# Patient Record
Sex: Female | Born: 1985 | Race: Black or African American | Hispanic: No | Marital: Married | Smoking: Current every day smoker
Health system: Southern US, Community
[De-identification: ages and names within clinical notes are randomized; demographics above are authoritative.]

## PROBLEM LIST (undated history)

## (undated) HISTORY — PX: NECK SURGERY: SHX720

---

## 2014-03-07 ENCOUNTER — Emergency Department (HOSPITAL_BASED_OUTPATIENT_CLINIC_OR_DEPARTMENT_OTHER): Payer: Self-pay

## 2014-03-07 ENCOUNTER — Emergency Department (HOSPITAL_BASED_OUTPATIENT_CLINIC_OR_DEPARTMENT_OTHER)
Admission: EM | Admit: 2014-03-07 | Discharge: 2014-03-07 | Disposition: A | Discharge status: Home / Self Care | Payer: Self-pay | Attending: Emergency Medicine | Admitting: Emergency Medicine

## 2014-03-07 ENCOUNTER — Encounter (HOSPITAL_BASED_OUTPATIENT_CLINIC_OR_DEPARTMENT_OTHER): Payer: Self-pay | Admitting: Emergency Medicine

## 2014-03-07 DIAGNOSIS — R202 Paresthesia of skin: Secondary | ICD-10-CM

## 2014-03-07 DIAGNOSIS — R51 Headache: Secondary | ICD-10-CM | POA: Insufficient documentation

## 2014-03-07 DIAGNOSIS — M542 Cervicalgia: Secondary | ICD-10-CM | POA: Insufficient documentation

## 2014-03-07 DIAGNOSIS — R200 Anesthesia of skin: Secondary | ICD-10-CM | POA: Insufficient documentation

## 2014-03-07 MED ORDER — HYDROCODONE-ACETAMINOPHEN 5-325 MG PO TABS
2.0000 | ORAL_TABLET | ORAL | Status: DC | PRN
Start: 2014-03-07 — End: 2016-02-26

## 2014-03-07 MED ORDER — PREDNISONE 10 MG PO TABS: ORAL_TABLET | ORAL | Status: DC

## 2014-03-07 NOTE — ED Provider Notes (Signed)
CSN: [id number]     Arrival date & time 03/07/14  1216 History   First MD Initiated Contact with Patient 03/07/14 1337     Chief Complaint  Patient presents with  . Headache  . Numbness     (Consider location/radiation/quality/duration/timing/severity/associated sxs/prior Treatment) Patient is a 28 y.o. female presenting with headaches. The history is provided by the patient. No language interpreter was used.  Headache Pain location:  Generalized Quality:  Dull Radiates to:  L arm Severity currently:  0/10 Severity at highest:  0/10 Onset quality:  Gradual Timing:  Constant Progression:  Worsening Chronicity:  New Similar to prior headaches: no   Context: not coughing   Relieved by:  Nothing Worsened by:  Nothing tried Ineffective treatments:  None tried Associated symptoms: neck pain and numbness   Associated symptoms: no back pain   Risk factors: no anger     No past medical history on file. No past surgical history on file. No family history on file. History  Substance Use Topics  . Smoking status: Never Smoker   . Smokeless tobacco: Not on file  . Alcohol Use: Not on file   OB History   Grav Para Term Preterm Abortions TAB SAB Ect Mult Living                 Review of Systems  Musculoskeletal: Positive for neck pain. Negative for back pain.  Neurological: Positive for numbness and headaches.  All other systems reviewed and are negative.     Allergies  Review of patient's allergies indicates no known allergies.  Home Medications   Prior to Admission medications   Not on File   BP 110/71  Pulse 75  Temp(Src) 98.9 F (37.2 C) (Oral)  Resp 16  Ht 5\' 6"  (1.676 m)  Wt 180 lb (81.647 kg)  BMI 29.07 kg/m2  SpO2 100%  LMP 02/16/2014 Physical Exam  Nursing note and vitals reviewed. Constitutional: She is oriented to person, place, and time. She appears well-developed and well-nourished.  HENT:  Head: Normocephalic.  Right Ear: External ear  normal.  Left Ear: External ear normal.  Nose: Nose normal.  Mouth/Throat: Oropharynx is clear and moist.  Eyes: Pupils are equal, round, and reactive to light.  Neck: Normal range of motion.  Cardiovascular: Normal rate and normal heart sounds.   Pulmonary/Chest: Effort normal.  Abdominal: Soft.  Musculoskeletal: Normal range of motion.  Neurological: She is alert and oriented to person, place, and time. She has normal reflexes.  Skin: Skin is warm.  Psychiatric: She has a normal mood and affect.    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 Multiplanar CT image reconstructions of the cervical spine were also generated.  COMPARISON:  CT head 12/18/2005  FINDINGS: CT HEAD FINDINGS  Normal ventricular morphology.  No midline shift or mass effect.  Normal appearance of brain parenchyma.  No intracranial hemorrhage, mass lesion, or acute infarction.  Visualized paranasal sinuses and mastoid air cells clear.  Bones unremarkable.  CT CERVICAL SPINE FINDINGS  Prevertebral soft tissues normal thickness.  Osseous mineralization normal.  Visualized skullbase intact.  Vertebral body and disc space heights maintained.  No acute fracture, subluxation or bone destruction.  Lung apices clear.  IMPRESSION: No acute intracranial abnormalities.  No acute cervical spine abnormalities.   Electronically Signed   By: Ulyses Southward M.D.   On: 03/07/2014 14:44     EKG Interpretation None      MDM   Pt's Mother  Had MS.   I will refer pt to  Neurology for evaluation.   Final diagnoses:  Numbness and tingling in left upper extremity    Hydrocodone Ibuprofen     Elson Areas, PA-C 03/07/14 1544

## 2014-03-07 NOTE — ED Notes (Signed)
Pt has headache, neck pain and left arm numbness for three days.  Pt states she had been doing a lot of lifting at work.  Good movement and pulses.

## 2014-03-07 NOTE — Discharge Instructions (Signed)

## 2014-03-07 NOTE — ED Provider Notes (Signed)
History/physical exam/procedure(s) were performed by non-physician practitioner and as supervising physician I was immediately available for consultation/collaboration. I have reviewed all notes and am in agreement with care and plan.   Rebecka Oelkers S Sausha Raymond, MD 03/07/14 1629 

## 2015-09-02 ENCOUNTER — Emergency Department (HOSPITAL_BASED_OUTPATIENT_CLINIC_OR_DEPARTMENT_OTHER)
Admission: EM | Admit: 2015-09-02 | Discharge: 2015-09-02 | Disposition: A | Discharge status: Home / Self Care | Payer: Self-pay | Attending: Emergency Medicine | Admitting: Emergency Medicine

## 2015-09-02 ENCOUNTER — Emergency Department (HOSPITAL_BASED_OUTPATIENT_CLINIC_OR_DEPARTMENT_OTHER): Payer: Self-pay

## 2015-09-02 ENCOUNTER — Encounter (HOSPITAL_BASED_OUTPATIENT_CLINIC_OR_DEPARTMENT_OTHER): Payer: Self-pay | Admitting: *Deleted

## 2015-09-02 DIAGNOSIS — Z791 Long term (current) use of non-steroidal anti-inflammatories (NSAID): Secondary | ICD-10-CM | POA: Insufficient documentation

## 2015-09-02 DIAGNOSIS — R52 Pain, unspecified: Secondary | ICD-10-CM

## 2015-09-02 DIAGNOSIS — F17210 Nicotine dependence, cigarettes, uncomplicated: Secondary | ICD-10-CM | POA: Insufficient documentation

## 2015-09-02 DIAGNOSIS — M79661 Pain in right lower leg: Secondary | ICD-10-CM | POA: Insufficient documentation

## 2015-09-02 MED ORDER — IBUPROFEN 800 MG PO TABS: 800.0000 mg | ORAL_TABLET | Freq: Three times a day (TID) | ORAL | Status: DC

## 2015-09-02 MED ORDER — KETOROLAC TROMETHAMINE 60 MG/2ML IM SOLN
60.0000 mg | Freq: Once | INTRAMUSCULAR | Status: AC
  Administered 2015-09-02: 60 mg via INTRAMUSCULAR
  Filled 2015-09-02: qty 2

## 2015-09-02 NOTE — Discharge Instructions (Signed)

## 2015-09-02 NOTE — ED Notes (Signed)
Pt tolerated instructions of how to use the crutches well.

## 2015-09-02 NOTE — ED Notes (Signed)
CMS intact before and after. Pt stated she understood procedure and care of injury when she leaves the ER. Pt stated she did not have any questions.

## 2015-09-02 NOTE — ED Provider Notes (Signed)
CSN: [id number]     Arrival date & time 09/02/15  1604 History   First MD Initiated Contact with Patient 09/02/15 1634     Chief Complaint  Patient presents with  . Ankle Pain   HPI  Amber Carrillo is a 30 year old female presenting with ankle and calf pain. Onset of pain was 3 days ago. She denies associated injury. She reports sharp, shooting pains in her anterior ankle. This intermittently radiates into her calf. She denies specific exacerbating factors and states that the pain radiates randomly. Ambulating and flexion/extension exacerbates the ankle pain. She reports associated swelling of the ankle and calf yesterday which has resolved upon presentation to the emergency department. She also notes intermittent numbness of the right foot and calf. She is not currently experiencing the numbness. She denies weakness of the right lower extremity but states that it is painful to move so she chooses not to. She has not tried any over-the-counter pain relievers. She did try Epsom salt baths for the swelling yesterday. She works a job where she is on her feet for 10 hours a day. States that work has exacerbated her pain over the past 3 days. Denies history of blood clots, hormonal birth control, recent long travel or recent immobility. No other complaints today.  History reviewed. No pertinent past medical history. Past Surgical History  Procedure Laterality Date  . Neck surgery     No family history on file. Social History  Substance Use Topics  . Smoking status: Current Every Day Smoker    Types: Cigarettes  . Smokeless tobacco: Never Used  . Alcohol Use: Yes     Comment: occasional   OB History    No data available     Review of Systems  All other systems reviewed and are negative.     Allergies  Review of patient's allergies indicates no known allergies.  Home Medications   Prior to Admission medications   Medication Sig Start Date End Date Taking? Authorizing Provider   HYDROcodone-acetaminophen (NORCO/VICODIN) 5-325 MG per tablet Take 2 tablets by mouth every 4 (four) hours as needed. 03/07/14   Elson Areas, PA-C  ibuprofen (ADVIL,MOTRIN) 800 MG tablet Take 1 tablet (800 mg total) by mouth 3 (three) times daily. 09/02/15   Ambrielle Kington, PA-C  predniSONE (DELTASONE) 10 MG tablet 6,5,4,3,2,1 taper 03/07/14   Lonia Skinner [name], PA-C   BP 117/76 mmHg  Pulse 75  Temp(Src) 98.3 F (36.8 C) (Oral)  Resp 20  Ht  (1.676 m)  Wt 83.915 kg  BMI 29.87 kg/m2  SpO2 100%  LMP 09/01/2015 (Exact Date) Physical Exam  Constitutional: She appears well-developed and well-nourished. No distress.  HENT:  Head: Normocephalic and atraumatic.  Right Ear: External ear normal.  Left Ear: External ear normal.  Eyes: Conjunctivae are normal. Right eye exhibits no discharge. Left eye exhibits no discharge. No scleral icterus.  Neck: Normal range of motion.  Cardiovascular: Normal rate and intact distal pulses.   Pedal pulses palpable  Pulmonary/Chest: Effort normal.  Musculoskeletal: Normal range of motion.  Tenderness to palpation of the right posterior calf. No appreciable asymmetric swelling. No palpable cords or erythema. Ankle is not tender to palpation. No swelling of the ankle. Full range of motion at the toes, ankle and knee. Patient reports pain on dorsiflexion and plantar flexion of the ankle.  Neurological: She is alert. Coordination normal.  5/5 ankle strength bilaterally. Sensation to light touch intact over the bilateral lower extremities  Skin: Skin  is warm and dry. No erythema.  No erythema, ecchymosis or other skin changes noted over the right lower extremity  Psychiatric: She has a normal mood and affect. Her behavior is normal.  Nursing note and vitals reviewed.   ED Course  Procedures (including critical care time) Labs Review Labs Reviewed - No data to display  Imaging Review Dg Ankle Complete Right  09/02/2015  CLINICAL DATA:  Ankle pain for 5  days, primarily at the dorsal surface of the tibiotalar joint, intermittent swelling. No known injury or previous injury. EXAM: RIGHT ANKLE - COMPLETE 3+ VIEW COMPARISON:  None. FINDINGS: Osseous alignment is normal. Bone mineralization is normal. No acute or suspicious osseous lesion. No fracture line or displaced fracture fragment. Ankle mortise is symmetric. No evidence of degenerative osteoarthritis. No erosions or other signs of an inflammatory arthritis. Soft tissues about the right ankle are unremarkable. IMPRESSION: Negative. Electronically Signed   By: Bary RichardStan  Maynard M.D.   On: 09/02/2015 16:46   [place] Venous Img Lower Unilateral Right  09/02/2015  CLINICAL DATA:  Right ankle pain and swelling 3 days.  No injury. EXAM: RIGHT LOWER EXTREMITY VENOUS DOPPLER ULTRASOUND TECHNIQUE: Gray-scale sonography with graded compression, as well as color Doppler and duplex ultrasound were performed to evaluate the lower extremity deep venous systems from the level of the common femoral vein and including the common femoral, femoral, profunda femoral, popliteal and calf veins including the posterior tibial, peroneal and gastrocnemius veins when visible. The superficial great saphenous vein was also interrogated. Spectral Doppler was utilized to evaluate flow at rest and with distal augmentation maneuvers in the common femoral, femoral and popliteal veins. COMPARISON:  None. FINDINGS: Contralateral Common Femoral Vein: Respiratory phasicity is normal and symmetric with the symptomatic side. No evidence of thrombus. Normal compressibility. Common Femoral Vein: No evidence of thrombus. Normal compressibility, respiratory phasicity and response to augmentation. Saphenofemoral Junction: No evidence of thrombus. Normal compressibility and flow on color Doppler imaging. Profunda Femoral Vein: No evidence of thrombus. Normal compressibility and flow on color Doppler imaging. Femoral Vein: No evidence of thrombus. Normal  compressibility, respiratory phasicity and response to augmentation. Popliteal Vein: No evidence of thrombus. Normal compressibility, respiratory phasicity and response to augmentation. Calf Veins: No evidence of thrombus. Normal compressibility and flow on color Doppler imaging. Superficial Great Saphenous Vein: No evidence of thrombus. Normal compressibility and flow on color Doppler imaging. Venous Reflux:  None. Other Findings:  None. IMPRESSION: No evidence of deep venous thrombosis. Electronically Signed   By: Elberta Fortisaniel  Boyle M.D.   On: 09/02/2015 19:16   I have personally reviewed and evaluated these images and lab results as part of my medical decision-making.   EKG Interpretation None      MDM   Final diagnoses:  Pain  Pain in right lower leg   Patient presenting with right ankle and calf pain x 3 days. No known trauma.  Right lower extremity is neurovascularly intact with FROM. Tenderness over posterior calf. Patient X-Ray negative for obvious fracture or dislocation. Venous ultrasound negative for DVT. Pain managed in ED with toradol. Crutches given and conservative therapy recommended. Discussed RICE therapy and use of OTC pain relievers. Pt advised to follow up with PCP if symptoms persist. Return precautions discussed at bedside and given in discharge paperwork. Pt is stable for discharge.     Rolm GalaStevi Quasim Doyon, PA-C 09/02/15 2056  Alvira MondayErin Schlossman, MD 09/04/15 [phone]

## 2015-09-02 NOTE — ED Notes (Signed)
Reports right ankle pain and swelling since Wednesday- denies injury

## 2016-02-26 ENCOUNTER — Encounter (HOSPITAL_BASED_OUTPATIENT_CLINIC_OR_DEPARTMENT_OTHER): Payer: Self-pay | Admitting: *Deleted

## 2016-02-26 ENCOUNTER — Emergency Department (HOSPITAL_BASED_OUTPATIENT_CLINIC_OR_DEPARTMENT_OTHER)
Admission: EM | Admit: 2016-02-26 | Discharge: 2016-02-26 | Disposition: A | Discharge status: Home / Self Care | Payer: Self-pay | Attending: Emergency Medicine | Admitting: Emergency Medicine

## 2016-02-26 DIAGNOSIS — N390 Urinary tract infection, site not specified: Secondary | ICD-10-CM | POA: Insufficient documentation

## 2016-02-26 DIAGNOSIS — F17210 Nicotine dependence, cigarettes, uncomplicated: Secondary | ICD-10-CM | POA: Insufficient documentation

## 2016-02-26 DIAGNOSIS — B9689 Other specified bacterial agents as the cause of diseases classified elsewhere: Secondary | ICD-10-CM

## 2016-02-26 DIAGNOSIS — N760 Acute vaginitis: Secondary | ICD-10-CM | POA: Insufficient documentation

## 2016-02-26 LAB — URINE MICROSCOPIC-ADD ON

## 2016-02-26 LAB — URINALYSIS, ROUTINE W REFLEX MICROSCOPIC
Glucose, UA: NEGATIVE mg/dL
Ketones, ur: 15 mg/dL — AB
NITRITE: POSITIVE — AB
PH: 5.5 (ref 5.0–8.0)
Protein, ur: NEGATIVE mg/dL
SPECIFIC GRAVITY, URINE: 1.027 (ref 1.005–1.030)

## 2016-02-26 LAB — WET PREP, GENITAL
SPERM: NONE SEEN
Trich, Wet Prep: NONE SEEN
YEAST WET PREP: NONE SEEN

## 2016-02-26 LAB — PREGNANCY, URINE: Preg Test, Ur: NEGATIVE

## 2016-02-26 MED ORDER — METRONIDAZOLE 500 MG PO TABS: 500.0000 mg | ORAL_TABLET | Freq: Two times a day (BID) | ORAL | 0 refills | Status: DC

## 2016-02-26 MED ORDER — METRONIDAZOLE 500 MG PO TABS
500.0000 mg | ORAL_TABLET | Freq: Once | ORAL | Status: AC
  Administered 2016-02-26: 500 mg via ORAL
  Filled 2016-02-26: qty 1

## 2016-02-26 MED ORDER — CEPHALEXIN 500 MG PO CAPS: 500.0000 mg | ORAL_CAPSULE | Freq: Two times a day (BID) | ORAL | 0 refills | Status: DC

## 2016-02-26 MED ORDER — CEPHALEXIN 250 MG PO CAPS
500.0000 mg | ORAL_CAPSULE | Freq: Once | ORAL | Status: AC
  Administered 2016-02-26: 500 mg via ORAL
  Filled 2016-02-26: qty 2

## 2016-02-26 NOTE — ED Triage Notes (Signed)
Abdominal pain and vomiting x 2 weeks.  Unsure of pregnancy.  LMP: aug 2017.

## 2016-02-26 NOTE — ED Provider Notes (Signed)
[facility]   CSN: [id number] Arrival date & time: 02/26/16  1800 By signing my name below, I, Bridgette Habermann, attest that this documentation has been prepared under the direction and in the presence of Tomasita Crumble, MD. Electronically Signed: Bridgette Habermann, ED Scribe. 02/26/16. 8:08 PM.  History   Chief Complaint Chief Complaint  Patient presents with  . Abdominal Pain   HPI Comments: Amber Carrillo is a 30 y.o. female who presents to the Emergency Department complaining of 7/10 abdominal pain radiating to her lower back onset two weeks ago with associated episodic vomiting and nausea. Pt also reports urinary frequency and intermittent vaginal discharge (beginning earlier this week). Pt has taken Ibuprofen with mild relief to her pain. Pt is unsure if she is pregnant. Her LNMP was August 2017. Pt denies fever, diarrhea, hematuria, dysuria, or any other associated symptoms.   The history is provided by the patient. No language interpreter was used.    History reviewed. No pertinent past medical history.  There are no active problems to display for this patient.   Past Surgical History:  Procedure Laterality Date  . NECK SURGERY      OB History    No data available       Home Medications    Prior to Admission medications   Not on File    Family History History reviewed. No pertinent family history.  Social History Social History  Substance Use Topics  . Smoking status: Current Every Day Smoker    Types: Cigarettes  . Smokeless tobacco: Never Used  . Alcohol use Yes     Comment: occasional     Allergies   Review of patient's allergies indicates no known allergies.   Review of Systems Review of Systems 10 Systems reviewed and all are negative for acute change except as noted in the HPI. Physical Exam Updated Vital Signs BP 122/82 (BP Location: Right Arm)   Pulse 77   Temp 98.2 F (36.8 C) (Oral)   Resp 20   Ht 5' 6.5" (1.689 m)   Wt  180 lb (81.6 kg)   LMP 01/02/2016 (Approximate)   SpO2 100%   BMI 28.62 kg/m   Physical Exam  Constitutional: She is oriented to person, place, and time. She appears well-developed and well-nourished. No distress.  HENT:  Head: Normocephalic and atraumatic.  Nose: Nose normal.  Mouth/Throat: Oropharynx is clear and moist. No oropharyngeal exudate.  Eyes: Conjunctivae and EOM are normal. Pupils are equal, round, and reactive to light. No scleral icterus.  Neck: Normal range of motion. Neck supple. No JVD present. No tracheal deviation present. No thyromegaly present.  Cardiovascular: Normal rate, regular rhythm and normal heart sounds.  Exam reveals no gallop and no friction rub.   No murmur heard. Pulmonary/Chest: Effort normal and breath sounds normal. No respiratory distress. She has no wheezes. She exhibits no tenderness.  Abdominal: Soft. Bowel sounds are normal. She exhibits no distension and no mass. There is no tenderness. There is no rebound and no guarding.  Genitourinary: Cervix exhibits no motion tenderness. Right adnexum displays no tenderness. Left adnexum displays no tenderness. No bleeding in the vagina. No vaginal discharge found.  Genitourinary Comments: Chaperone present throughout entire exam.  Musculoskeletal: Normal range of motion. She exhibits no edema or tenderness.  Lymphadenopathy:    She has no cervical adenopathy.  Neurological: She is alert and oriented to person, place, and time. No cranial nerve deficit. She exhibits normal muscle tone.  Skin:  Skin is warm and dry. No rash noted. No erythema. No pallor.  Nursing note and vitals reviewed.  ED Treatments / Results  DIAGNOSTIC STUDIES: Oxygen Saturation is 100% on RA, normal by my interpretation.    COORDINATION OF CARE: 8:05 PM Discussed treatment plan with pt at bedside which includes pelvic exam and pt agreed to plan.  Labs (all labs ordered are listed, but only abnormal results are displayed) Labs  Reviewed  URINALYSIS, ROUTINE W REFLEX MICROSCOPIC (NOT AT [facility]) - Abnormal; Notable for the following:       Result Value   APPearance CLOUDY (*)    Hgb urine dipstick TRACE (*)    Bilirubin Urine SMALL (*)    Ketones, ur 15 (*)    Nitrite POSITIVE (*)    Leukocytes, UA SMALL (*)    All other components within normal limits  URINE MICROSCOPIC-ADD ON - Abnormal; Notable for the following:    Squamous Epithelial / LPF 6-30 (*)    Bacteria, UA MANY (*)    All other components within normal limits  PREGNANCY, URINE    EKG  EKG Interpretation None       Radiology No results found.  Procedures Procedures (including critical care time)  Medications Ordered in ED Medications - No data to display   Initial Impression / Assessment and Plan / ED Course  I have reviewed the triage vital signs and the nursing notes.  Pertinent labs & imaging results that were available during my care of the patient were reviewed by me and considered in my medical decision making (see chart for details).  Clinical Course    Patient presents to the emergency department for abdominal pain and urinary symptoms. Also vaginal discharge. Urinalysis reveals an infection and she has BV, likely the source of her white discharge. She was treated with Keflex and Flagyl. Primary care follow-up advised within 3 days. She appears well and in no acute distress, vital signs remain within her normal limits and she is safe for discharge.  Final Clinical Impressions(s) / ED Diagnoses   Final diagnoses:  None   I personally performed the services described in this documentation, which was scribed in my presence. The recorded information has been reviewed and is accurate.     New Prescriptions New Prescriptions   No medications on file     Tomasita CrumbleAdeleke Will Heinkel, MD 02/26/16 2114

## 2016-02-27 LAB — GC/CHLAMYDIA PROBE AMP (~~LOC~~) NOT AT ARMC
Chlamydia: NEGATIVE
Neisseria Gonorrhea: NEGATIVE

## 2016-07-27 ENCOUNTER — Encounter (HOSPITAL_BASED_OUTPATIENT_CLINIC_OR_DEPARTMENT_OTHER): Payer: Self-pay | Admitting: Emergency Medicine

## 2016-07-27 ENCOUNTER — Emergency Department (HOSPITAL_BASED_OUTPATIENT_CLINIC_OR_DEPARTMENT_OTHER)
Admission: EM | Admit: 2016-07-27 | Discharge: 2016-07-27 | Disposition: A | Discharge status: Home / Self Care | Payer: Self-pay | Attending: Emergency Medicine | Admitting: Emergency Medicine

## 2016-07-27 DIAGNOSIS — F17290 Nicotine dependence, other tobacco product, uncomplicated: Secondary | ICD-10-CM | POA: Insufficient documentation

## 2016-07-27 DIAGNOSIS — H1032 Unspecified acute conjunctivitis, left eye: Secondary | ICD-10-CM | POA: Insufficient documentation

## 2016-07-27 LAB — PREGNANCY, URINE: Preg Test, Ur: NEGATIVE

## 2016-07-27 MED ORDER — GENTAMICIN SULFATE 0.3 % OP SOLN: 1.0000 [drp] | OPHTHALMIC | 0 refills | Status: DC

## 2016-07-27 NOTE — Discharge Instructions (Signed)
Warm compresses to eye for 5 minutes after placing drops.

## 2016-07-27 NOTE — ED Triage Notes (Signed)
OS with yellow drainage with blurred vision yesterday. H/A and pain to Os, swelling noted to left peri-orbital edema

## 2016-07-27 NOTE — ED Provider Notes (Signed)
[facility]   CSN: [id number] Arrival date & time: 07/27/16  1217     History   Chief Complaint No chief complaint on file.   HPI Amber Carrillo is a 31 y.o. female.Chief complaint is left eye pain and drainage.  HPI:  Patient complains of left eye drainage and pain. Symptoms since yesterday. Her left eye was "bloodshot" yesterday. No fever. No pain or swelling anterior to the ears or in the neck. No sore throat or URI symptoms. No injury or trauma.  History reviewed. No pertinent past medical history.  There are no active problems to display for this patient.   Past Surgical History:  Procedure Laterality Date  . NECK SURGERY      OB History    No data available       Home Medications    Prior to Admission medications   Medication Sig Start Date End Date Taking? Authorizing Provider  cephALEXin (KEFLEX) 500 MG capsule Take 1 capsule (500 mg total) by mouth 2 (two) times daily. 02/26/16   Tomasita CrumbleAdeleke Oni, MD  gentamicin (GARAMYCIN) 0.3 % ophthalmic solution Place 1 drop into the left eye every 4 (four) hours. 07/27/16   Rolland PorterMark Jadaya Sommerfield, MD  metroNIDAZOLE (FLAGYL) 500 MG tablet Take 1 tablet (500 mg total) by mouth 2 (two) times daily. One po bid x 7 days 02/26/16   Tomasita CrumbleAdeleke Oni, MD    Family History No family history on file.  Social History Social History  Substance Use Topics  . Smoking status: Current Every Day Smoker    Packs/day: 1.00    Types: Cigars  . Smokeless tobacco: Never Used  . Alcohol use Yes     Comment: occasional     Allergies   Patient has no known allergies.   Review of Systems Review of Systems  Constitutional: Negative for appetite change, chills, diaphoresis, fatigue and fever.  HENT: Negative for mouth sores, sore throat and trouble swallowing.   Eyes: Positive for discharge and redness. Negative for visual disturbance.  Respiratory: Negative for cough, chest tightness, shortness of breath and wheezing.     Cardiovascular: Negative for chest pain.  Gastrointestinal: Negative for abdominal distention, abdominal pain, diarrhea, nausea and vomiting.  Endocrine: Negative for polydipsia, polyphagia and polyuria.  Genitourinary: Negative for dysuria, frequency and hematuria.  Musculoskeletal: Negative for gait problem.  Skin: Negative for color change, pallor and rash.  Neurological: Negative for dizziness, syncope, light-headedness and headaches.  Hematological: Does not bruise/bleed easily.  Psychiatric/Behavioral: Negative for behavioral problems and confusion.     Physical Exam Updated Vital Signs BP 104/66 (BP Location: Right Arm)   Pulse 95   Temp 98.5 F (36.9 C) (Oral)   Resp 16   Ht 5' 6.25" (1.683 m)   Wt 190 lb (86.2 kg)   LMP 06/18/2016 (Approximate) Comment: Irregular periods, no using BC  SpO2 100%   BMI 30.44 kg/m   Physical Exam  Constitutional: She is oriented to person, place, and time. She appears well-developed and well-nourished. No distress.  HENT:  Head: Normocephalic.  Eyes: Conjunctivae are normal. Pupils are equal, round, and reactive to light. No scleral icterus.  Conjunctival injection of left eye. No proptosis or lid edema. No periorbital erythema. Normal extra ocular movements. Normal reactive pupil. Patient states normal vision.  Neck: Normal range of motion. Neck supple. No thyromegaly present.  Cardiovascular: Normal rate and regular rhythm.  Exam reveals no gallop and no friction rub.   No murmur heard. Pulmonary/Chest: Effort  normal and breath sounds normal. No respiratory distress. She has no wheezes. She has no rales.  Abdominal: Soft. Bowel sounds are normal. She exhibits no distension. There is no tenderness. There is no rebound.  Musculoskeletal: Normal range of motion.  Neurological: She is alert and oriented to person, place, and time.  Skin: Skin is warm and dry. No rash noted.  Psychiatric: She has a normal mood and affect. Her behavior is  normal.     ED Treatments / Results  Labs (all labs ordered are listed, but only abnormal results are displayed) Labs Reviewed  PREGNANCY, URINE    EKG  EKG Interpretation None       Radiology No results found.  Procedures Procedures (including critical care time)  Medications Ordered in ED Medications - No data to display   Initial Impression / Assessment and Plan / ED Course  I have reviewed the triage vital signs and the nursing notes.  Pertinent labs & imaging results that were available during my care of the patient were reviewed by me and considered in my medical decision making (see chart for details).     Garamycin drops. Warm compresses.  Final Clinical Impressions(s) / ED Diagnoses   Final diagnoses:  Acute conjunctivitis of left eye, unspecified acute conjunctivitis type    New Prescriptions New Prescriptions   GENTAMICIN (GARAMYCIN) 0.3 % OPHTHALMIC SOLUTION    Place 1 drop into the left eye every 4 (four) hours.     Rolland Porter, MD 07/27/16 1335

## 2017-01-25 ENCOUNTER — Emergency Department (HOSPITAL_BASED_OUTPATIENT_CLINIC_OR_DEPARTMENT_OTHER)
Admission: EM | Admit: 2017-01-25 | Discharge: 2017-01-25 | Disposition: A | Discharge status: Home / Self Care | Payer: Self-pay | Attending: Emergency Medicine | Admitting: Emergency Medicine

## 2017-01-25 ENCOUNTER — Emergency Department (HOSPITAL_BASED_OUTPATIENT_CLINIC_OR_DEPARTMENT_OTHER): Payer: Self-pay

## 2017-01-25 ENCOUNTER — Encounter (HOSPITAL_BASED_OUTPATIENT_CLINIC_OR_DEPARTMENT_OTHER): Payer: Self-pay

## 2017-01-25 DIAGNOSIS — F17290 Nicotine dependence, other tobacco product, uncomplicated: Secondary | ICD-10-CM | POA: Insufficient documentation

## 2017-01-25 DIAGNOSIS — N390 Urinary tract infection, site not specified: Secondary | ICD-10-CM | POA: Insufficient documentation

## 2017-01-25 DIAGNOSIS — R109 Unspecified abdominal pain: Secondary | ICD-10-CM

## 2017-01-25 LAB — CBC WITH DIFFERENTIAL/PLATELET
Basophils Absolute: 0 10*3/uL (ref 0.0–0.1)
Basophils Relative: 0 %
Eosinophils Absolute: 0.1 10*3/uL (ref 0.0–0.7)
Eosinophils Relative: 1 %
HCT: 34.7 % — ABNORMAL LOW (ref 36.0–46.0)
Hemoglobin: 11.5 g/dL — ABNORMAL LOW (ref 12.0–15.0)
LYMPHS PCT: 18 %
Lymphs Abs: 1.3 10*3/uL (ref 0.7–4.0)
MCH: 31.9 pg (ref 26.0–34.0)
MCHC: 33.1 g/dL (ref 30.0–36.0)
MCV: 96.4 fL (ref 78.0–100.0)
Monocytes Absolute: 0.6 10*3/uL (ref 0.1–1.0)
Monocytes Relative: 8 %
Neutro Abs: 5.3 10*3/uL (ref 1.7–7.7)
Neutrophils Relative %: 73 %
Platelets: 250 10*3/uL (ref 150–400)
RBC: 3.6 MIL/uL — AB (ref 3.87–5.11)
RDW: 13.1 % (ref 11.5–15.5)
WBC: 7.2 10*3/uL (ref 4.0–10.5)

## 2017-01-25 LAB — COMPREHENSIVE METABOLIC PANEL
ALT: 14 U/L (ref 14–54)
AST: 17 U/L (ref 15–41)
Albumin: 3.9 g/dL (ref 3.5–5.0)
Alkaline Phosphatase: 46 U/L (ref 38–126)
Anion gap: 7 (ref 5–15)
BILIRUBIN TOTAL: 0.6 mg/dL (ref 0.3–1.2)
BUN: 9 mg/dL (ref 6–20)
CHLORIDE: 108 mmol/L (ref 101–111)
CO2: 23 mmol/L (ref 22–32)
CREATININE: 0.63 mg/dL (ref 0.44–1.00)
Calcium: 8.7 mg/dL — ABNORMAL LOW (ref 8.9–10.3)
GFR calc Af Amer: >60 mL/min (ref >60)
Glucose, Bld: 109 mg/dL — ABNORMAL HIGH (ref 65–99)
Potassium: 3.8 mmol/L (ref 3.5–5.1)
Sodium: 138 mmol/L (ref 135–145)
TOTAL PROTEIN: 7.4 g/dL (ref 6.5–8.1)

## 2017-01-25 LAB — URINALYSIS, ROUTINE W REFLEX MICROSCOPIC
Bilirubin Urine: NEGATIVE
Glucose, UA: NEGATIVE mg/dL
Hgb urine dipstick: NEGATIVE
Ketones, ur: 15 mg/dL — AB
Nitrite: NEGATIVE
PH: 7 (ref 5.0–8.0)
Protein, ur: NEGATIVE mg/dL
SPECIFIC GRAVITY, URINE: 1.026 (ref 1.005–1.030)

## 2017-01-25 LAB — PREGNANCY, URINE: PREG TEST UR: NEGATIVE

## 2017-01-25 LAB — URINALYSIS, MICROSCOPIC (REFLEX): RBC / HPF: NONE SEEN RBC/hpf (ref 0–5)

## 2017-01-25 MED ORDER — DEXTROSE 5 % IV SOLN
1.0000 g | Freq: Once | INTRAVENOUS | Status: AC
  Administered 2017-01-25: 1 g via INTRAVENOUS
  Filled 2017-01-25: qty 10

## 2017-01-25 MED ORDER — CEPHALEXIN 500 MG PO CAPS: 500.0000 mg | ORAL_CAPSULE | Freq: Three times a day (TID) | ORAL | 0 refills | Status: DC

## 2017-01-25 MED ORDER — IBUPROFEN 800 MG PO TABS: 800.0000 mg | ORAL_TABLET | Freq: Three times a day (TID) | ORAL | 0 refills | Status: DC | PRN

## 2017-01-25 MED ORDER — SODIUM CHLORIDE 0.9 % IV BOLUS (SEPSIS)
1000.0000 mL | Freq: Once | INTRAVENOUS | Status: AC
  Administered 2017-01-25: 1000 mL via INTRAVENOUS

## 2017-01-25 MED ORDER — KETOROLAC TROMETHAMINE 15 MG/ML IJ SOLN
15.0000 mg | Freq: Once | INTRAMUSCULAR | Status: AC
  Administered 2017-01-25: 15 mg via INTRAVENOUS
  Filled 2017-01-25: qty 1

## 2017-01-25 NOTE — ED Triage Notes (Signed)
Pt reports right flank pain x1 day. Associated nausea, diarrhea previously.

## 2017-01-25 NOTE — ED Notes (Signed)
Patient is A & O x4.  She understood discharge instructions. 

## 2017-01-25 NOTE — ED Notes (Signed)
ED Provider at bedside. 

## 2017-01-25 NOTE — ED Provider Notes (Signed)
[facility]-EMERGENCY DEPT Provider Note   CSN: [id number] Arrival date & time: 01/25/17  1223     History   Chief Complaint Chief Complaint  Patient presents with  . Flank Pain    HPI Amber Carrillo is a 31 y.o. female.  The history is provided by the patient. No language interpreter was used.  Flank Pain     Amber Carrillo is a 31 y.o. female who presents to the Emergency Department complaining of severe flank pain.  She reports right flank pain that started yesterday afternoon. Pain is sharp and constant in nature. She has associated nausea. No fever, vomiting, dysuria. He does have some increased urinary frequency.Pain is nonradiating. No prior similar symptoms.  History reviewed. No pertinent past medical history.  There are no active problems to display for this patient.   Past Surgical History:  Procedure Laterality Date  . CESAREAN SECTION    . NECK SURGERY      OB History    No data available       Home Medications    Prior to Admission medications   Medication Sig Start Date End Date Taking? Authorizing Provider  cephALEXin (KEFLEX) 500 MG capsule Take 1 capsule (500 mg total) by mouth 3 (three) times daily. 01/25/17   Tilden Fossa, MD  gentamicin (GARAMYCIN) 0.3 % ophthalmic solution Place 1 drop into the left eye every 4 (four) hours. 07/27/16   Rolland Porter, MD  ibuprofen (ADVIL,MOTRIN) 800 MG tablet Take 1 tablet (800 mg total) by mouth every 8 (eight) hours as needed. 01/25/17   Tilden Fossa, MD  metroNIDAZOLE (FLAGYL) 500 MG tablet Take 1 tablet (500 mg total) by mouth 2 (two) times daily. One po bid x 7 days 02/26/16   Tomasita Crumble, MD    Family History History reviewed. No pertinent family history.  Social History Social History  Substance Use Topics  . Smoking status: Current Every Day Smoker    Packs/day: 1.00    Types: Cigars  . Smokeless tobacco: Never Used  . Alcohol use Yes     Comment: occasional     Allergies   Patient has no  known allergies.   Review of Systems Review of Systems  Genitourinary: Positive for flank pain.  All other systems reviewed and are negative.    Physical Exam Updated Vital Signs BP 103/73 (BP Location: Right Arm)   Pulse 70   Temp 98.4 F (36.9 C) (Oral)   Resp 18   Ht 5' 6.25" (1.683 m)   Wt 86.2 kg (190 lb)   LMP 12/30/2016   SpO2 100%   BMI 30.44 kg/m   Physical Exam  Constitutional: She is oriented to person, place, and time. She appears well-developed and well-nourished.  HENT:  Head: Normocephalic and atraumatic.  Cardiovascular: Normal rate and regular rhythm.   No murmur heard. Pulmonary/Chest: Effort normal and breath sounds normal. No respiratory distress.  Abdominal: Soft. There is no rebound and no guarding.  Mild right CVA tenderness mild right upper quadrant tenderness  Musculoskeletal: She exhibits no edema or tenderness.  Neurological: She is alert and oriented to person, place, and time.  Skin: Skin is warm and dry.  Psychiatric: She has a normal mood and affect. Her behavior is normal.  Nursing note and vitals reviewed.    ED Treatments / Results  Labs (all labs ordered are listed, but only abnormal results are displayed) Labs Reviewed  URINALYSIS, ROUTINE W REFLEX MICROSCOPIC - Abnormal; Notable for the following:  Result Value   APPearance CLOUDY (*)    Ketones, ur 15 (*)    Leukocytes, UA TRACE (*)    All other components within normal limits  CBC WITH DIFFERENTIAL/PLATELET - Abnormal; Notable for the following:    RBC 3.60 (*)    Hemoglobin 11.5 (*)    HCT 34.7 (*)    All other components within normal limits  COMPREHENSIVE METABOLIC PANEL - Abnormal; Notable for the following:    Glucose, Bld 109 (*)    Calcium 8.7 (*)    All other components within normal limits  URINALYSIS, MICROSCOPIC (REFLEX) - Abnormal; Notable for the following:    Bacteria, UA MANY (*)    Squamous Epithelial / LPF 6-30 (*)    All other components  within normal limits  URINE CULTURE  PREGNANCY, URINE    EKG  EKG Interpretation None       Radiology Ct Renal Stone Study  Result Date: 01/25/2017 CLINICAL DATA:  Three day history of urinary frequency. EXAM: CT ABDOMEN AND PELVIS WITHOUT CONTRAST TECHNIQUE: Multidetector CT imaging of the abdomen and pelvis was performed following the standard protocol without IV contrast. COMPARISON:  None. FINDINGS: Lower chest: Unremarkable Hepatobiliary: No focal abnormality in the liver on this study without intravenous contrast. There is no evidence for gallstones, gallbladder wall thickening, or pericholecystic fluid. No intrahepatic or extrahepatic biliary dilation. Pancreas: No focal mass lesion. No dilatation of the main duct. No intraparenchymal cyst. No peripancreatic edema. Spleen: No splenomegaly. No focal mass lesion. Adrenals/Urinary Tract: No adrenal nodule or mass. Kidneys unremarkable. No evidence for hydroureter. The urinary bladder appears normal for the degree of distention. Stomach/Bowel: Stomach is nondistended. No gastric wall thickening. No evidence of outlet obstruction. Duodenum is normally positioned as is the ligament of Treitz. No small bowel wall thickening. No small bowel dilatation. The terminal ileum is normal. The appendix is not visualized, but there is no edema or inflammation in the region of the cecum. No gross colonic mass. No colonic wall thickening. No substantial diverticular change. Vascular/Lymphatic: No abdominal aortic aneurysm. No abdominal aortic atherosclerotic calcification. There is no gastrohepatic or hepatoduodenal ligament lymphadenopathy. No intraperitoneal or retroperitoneal lymphadenopathy. No pelvic sidewall lymphadenopathy. Reproductive: The uterus has normal CT imaging appearance. There is no adnexal mass. Other: Trace intraperitoneal free fluid Musculoskeletal: Bone windows reveal no worrisome lytic or sclerotic osseous lesions. IMPRESSION: 1. No acute  findings in the abdomen or pelvis. Specifically, no evidence for urinary stones. Electronically Signed   By: Kennith Center M.D.   On: 01/25/2017 14:55    Procedures Procedures (including critical care time)  Medications Ordered in ED Medications  sodium chloride 0.9 % bolus 1,000 mL (0 mLs Intravenous Stopped 01/25/17 1552)  ketorolac (TORADOL) 15 MG/ML injection 15 mg (15 mg Intravenous Given 01/25/17 1444)  cefTRIAXone (ROCEPHIN) 1 g in dextrose 5 % 50 mL IVPB (0 g Intravenous Stopped 01/25/17 1552)     Initial Impression / Assessment and Plan / ED Course  I have reviewed the triage vital signs and the nursing notes.  Pertinent labs & imaging results that were available during my care of the patient were reviewed by me and considered in my medical decision making (see chart for details).     Patient here for evaluation of right flank pain, nausea, frequency. Her pain is improved following Toradol administration in the emergency department. Initial concern for possible kidney stone. CT stone study obtained that is negative for acute stone or acute disease process. UA does  have many bacteria present, question developing UTI, will treat with antibiotics. Abdominal examination is benign, current clinical picture is not consistent with acute appendicitis, cholecystitis. Discussed with patient home care for flank pain and likely UTI. Discussed outpatient follow up and return precautions.    Final Clinical Impressions(s) / ED Diagnoses   Final diagnoses:  Right flank pain  Acute UTI    New Prescriptions Discharge Medication List as of 01/25/2017  3:29 PM    START taking these medications   Details  ibuprofen (ADVIL,MOTRIN) 800 MG tablet Take 1 tablet (800 mg total) by mouth every 8 (eight) hours as needed., Starting Sat 01/25/2017, Print         Tilden Fossa, MD 01/26/17 [phone]

## 2017-01-27 LAB — URINE CULTURE

## 2017-03-15 ENCOUNTER — Emergency Department (HOSPITAL_BASED_OUTPATIENT_CLINIC_OR_DEPARTMENT_OTHER): Payer: Self-pay

## 2017-03-15 ENCOUNTER — Emergency Department (HOSPITAL_BASED_OUTPATIENT_CLINIC_OR_DEPARTMENT_OTHER)
Admission: EM | Admit: 2017-03-15 | Discharge: 2017-03-15 | Disposition: A | Discharge status: Home / Self Care | Payer: Self-pay | Attending: Emergency Medicine | Admitting: Emergency Medicine

## 2017-03-15 ENCOUNTER — Encounter (HOSPITAL_BASED_OUTPATIENT_CLINIC_OR_DEPARTMENT_OTHER): Payer: Self-pay | Admitting: Emergency Medicine

## 2017-03-15 DIAGNOSIS — O26891 Other specified pregnancy related conditions, first trimester: Secondary | ICD-10-CM | POA: Insufficient documentation

## 2017-03-15 DIAGNOSIS — R102 Pelvic and perineal pain: Secondary | ICD-10-CM | POA: Insufficient documentation

## 2017-03-15 DIAGNOSIS — M899 Disorder of bone, unspecified: Secondary | ICD-10-CM

## 2017-03-15 DIAGNOSIS — N841 Polyp of cervix uteri: Secondary | ICD-10-CM | POA: Insufficient documentation

## 2017-03-15 DIAGNOSIS — Z3A01 Less than 8 weeks gestation of pregnancy: Secondary | ICD-10-CM | POA: Insufficient documentation

## 2017-03-15 DIAGNOSIS — F17210 Nicotine dependence, cigarettes, uncomplicated: Secondary | ICD-10-CM | POA: Insufficient documentation

## 2017-03-15 DIAGNOSIS — O99331 Smoking (tobacco) complicating pregnancy, first trimester: Secondary | ICD-10-CM | POA: Insufficient documentation

## 2017-03-15 LAB — WET PREP, GENITAL
Clue Cells Wet Prep HPF POC: NONE SEEN
SPERM: NONE SEEN
TRICH WET PREP: NONE SEEN
YEAST WET PREP: NONE SEEN

## 2017-03-15 LAB — URINALYSIS, ROUTINE W REFLEX MICROSCOPIC
Bilirubin Urine: NEGATIVE
Glucose, UA: NEGATIVE mg/dL
Hgb urine dipstick: NEGATIVE
Ketones, ur: NEGATIVE mg/dL
Leukocytes, UA: NEGATIVE
NITRITE: NEGATIVE
PROTEIN: NEGATIVE mg/dL
SPECIFIC GRAVITY, URINE: 1.015 (ref 1.005–1.030)
pH: 7 (ref 5.0–8.0)

## 2017-03-15 LAB — HCG, QUANTITATIVE, PREGNANCY: hCG, Beta Chain, Quant, S: 71891 m[IU]/mL — ABNORMAL HIGH (ref <5)

## 2017-03-15 LAB — PREGNANCY, URINE: PREG TEST UR: POSITIVE — AB

## 2017-03-15 LAB — ABO/RH: ABO/RH(D): O POS

## 2017-03-15 NOTE — ED Notes (Signed)
Patient is A & O x4.  She understood AVS instructions.  

## 2017-03-15 NOTE — ED Notes (Signed)
Informed PA of patient's BP.  Patient denies being dizzy or headaches when standing/getting dressed.

## 2017-03-15 NOTE — ED Provider Notes (Signed)
[facility]   CSN: [id number] Arrival date & time: 03/15/17  1230     History   Chief Complaint Chief Complaint  Patient presents with  . Pelvic Pain    HPI Amber Carrillo is a 31 y.o. female.presents emergency Department with chief complaint of pelvic pain. The patient states that she took a home pregnancy test this week which was positive. She's been trying to get pregnant with her husband who is here by her bedside. She complains of pain along this pubic symphysis. She is unsure of the the first day of her last menstrual period.  She is also noticed some brownish colored discharge. She denies fever, chills, urinary symptoms. She has not seen her gynecologist and last  2 years. She is G2 P1001.  HPI  History reviewed. No pertinent past medical history.  There are no active problems to display for this patient.   Past Surgical History:  Procedure Laterality Date  . CESAREAN SECTION    . NECK SURGERY      OB History    Gravida Para Term Preterm AB Living   1             SAB TAB Ectopic Multiple Live Births                   Home Medications    Prior to Admission medications   Medication Sig Start Date End Date Taking? Authorizing Provider  cephALEXin (KEFLEX) 500 MG capsule Take 1 capsule (500 mg total) by mouth 3 (three) times daily. 01/25/17   Tilden Fossa, MD  gentamicin (GARAMYCIN) 0.3 % ophthalmic solution Place 1 drop into the left eye every 4 (four) hours. 07/27/16   Rolland Porter, MD  ibuprofen (ADVIL,MOTRIN) 800 MG tablet Take 1 tablet (800 mg total) by mouth every 8 (eight) hours as needed. 01/25/17   Tilden Fossa, MD  metroNIDAZOLE (FLAGYL) 500 MG tablet Take 1 tablet (500 mg total) by mouth 2 (two) times daily. One po bid x 7 days 02/26/16   Tomasita Crumble, MD    Family History History reviewed. No pertinent family history.  Social History Social History  Substance Use Topics  . Smoking status: Current Every Day Smoker   Packs/day: 1.00    Types: Cigars  . Smokeless tobacco: Never Used  . Alcohol use Yes     Comment: occasional     Allergies   Patient has no known allergies.   Review of Systems Review of Systems Ten systems reviewed and are negative for acute change, except as noted in the HPI.    Physical Exam Updated Vital Signs BP 96/60 (BP Location: Right Arm)   Pulse 74   Temp 98.1 F (36.7 C) (Oral)   Resp 18   Ht  (1.676 m)   Wt 83.9 kg (185 lb)   LMP 02/01/2017 (LMP Unknown)   SpO2 100%   BMI 29.86 kg/m   Physical Exam  Constitutional: She is oriented to person, place, and time. She appears well-developed and well-nourished. No distress.  HENT:  Head: Normocephalic and atraumatic.  Eyes: Conjunctivae are normal. No scleral icterus.  Neck: Normal range of motion.  Cardiovascular: Normal rate, regular rhythm and normal heart sounds.  Exam reveals no gallop and no friction rub.   No murmur heard. Pulmonary/Chest: Effort normal and breath sounds normal. No respiratory distress.  Abdominal: Soft. Bowel sounds are normal. She exhibits no distension and no mass. There is no tenderness. There is no guarding.  Genitourinary:  Genitourinary Comments: Pelvic exam: VULVA: normal appearing vulva with no masses, tenderness or lesions, VAGINA: normal appearing vagina with normal color and discharge, no lesions, CERVIX: endocervical polyp size 1 cm, UTERUS: uterus is normal size, shape, consistency and nontender, ADNEXA: normal adnexa in size, nontender and no masses.   Musculoskeletal:   Tender to palpation along the pubic symphysis. No abdominal tenderness  Neurological: She is alert and oriented to person, place, and time.  Skin: Skin is warm and dry. She is not diaphoretic.  Psychiatric: Her behavior is normal.  Nursing note and vitals reviewed.    ED Treatments / Results  Labs (all labs ordered are listed, but only abnormal results are displayed) Labs Reviewed  WET PREP,  GENITAL - Abnormal; Notable for the following:       Result Value   WBC, Wet Prep HPF POC MANY (*)    All other components within normal limits  PREGNANCY, URINE - Abnormal; Notable for the following:    Preg Test, Ur POSITIVE (*)    All other components within normal limits  HCG, QUANTITATIVE, PREGNANCY - Abnormal; Notable for the following:    hCG, Beta Chain, Quant, S 71,891 (*)    All other components within normal limits  URINALYSIS, ROUTINE W REFLEX MICROSCOPIC  RPR  HIV ANTIBODY (ROUTINE TESTING)  ABO/RH  GC/CHLAMYDIA PROBE AMP ([place]) NOT AT [facility]    EKG  EKG Interpretation None       Radiology US Ob Comp < 14 Wks  Result Date: 03/15/2017 CLINICAL DATA:  Mid pelvic pain x1 half weeks, pregnant EXAM: OBSTETRIC <14 WK [place] AND TRANSVAGINAL OB US TECHNIQUE: Both transabdominal and transvaginal ultrasound examinations were performed for complete evaluation of the gestation as well as the maternal uterus, adnexal regions, and pelvic cul-de-sac. Transvaginal technique was performed to assess early pregnancy. COMPARISON:  None. FINDINGS: Intrauterine gestational sac: Single Yolk sac:  Visualized. Embryo:  Visualized. Cardiac Activity: Visualized. Heart Rate: 133  bpm CRL:  7.4  mm   6 w   4 d                  [place] EDC: 11/04/2017 Subchorionic hemorrhage:  None visualized. Maternal uterus/adnexae: Bilateral ovaries are not discretely visualized. Trace pelvic fluid. IMPRESSION: Single live intrauterine gestation with estimated gestational age [redacted] weeks 4 days by crown-rump length. Electronically Signed   By: Charline Bills M.D.   On: 03/15/2017 16:13   US Ob Transvaginal  Result Date: 03/15/2017 CLINICAL DATA:  Mid pelvic pain x1 half weeks, pregnant EXAM: OBSTETRIC <14 WK [place] AND TRANSVAGINAL OB US TECHNIQUE: Both transabdominal and transvaginal ultrasound examinations were performed for complete evaluation of the gestation as well as the maternal uterus, adnexal regions, and pelvic  cul-de-sac. Transvaginal technique was performed to assess early pregnancy. COMPARISON:  None. FINDINGS: Intrauterine gestational sac: Single Yolk sac:  Visualized. Embryo:  Visualized. Cardiac Activity: Visualized. Heart Rate: 133  bpm CRL:  7.4  mm   6 w   4 d                  [place] EDC: 11/04/2017 Subchorionic hemorrhage:  None visualized. Maternal uterus/adnexae: Bilateral ovaries are not discretely visualized. Trace pelvic fluid. IMPRESSION: Single live intrauterine gestation with estimated gestational age [redacted] weeks 4 days by crown-rump length. Electronically Signed   By: Charline Bills M.D.   On: 03/15/2017 16:13    Procedures Procedures (including critical care time)  Medications Ordered in ED Medications -  No data to display   Initial Impression / Assessment and Plan / ED Course  I have reviewed the triage vital signs and the nursing notes.  Pertinent labs & imaging results that were available during my care of the patient were reviewed by me and considered in my medical decision making (see chart for details).  Clinical Course as of Mar 16 1643  Sat Mar 15, 2017  1636 HCG, Newman Nickels 16,109 [AH]    Clinical Course User Index [AH] Arthor Captain, PA-C     patient pregnant estimated  Gestational age of the fetus at 6 weeks 4 days. Her hCG levels appear to be on track for this date. She has a noted endocervical polyp on examination which she should follow up about. Blood pressures are low however this is normal on pregnancy and she denies orthostatic hypotension. The patient is currently taking prenatal vitamins and appears safe for discharge at this time. No concern for infections or heterotopic pregnancy.  Final Clinical Impressions(s) / ED Diagnoses   Final diagnoses:  Less than [redacted] weeks gestation of pregnancy  Pubic bone pain  Polyp at cervical os    New Prescriptions New Prescriptions   No medications on file     Arthor Captain, PA-C 03/15/17 1708      Doug Sou, MD 03/16/17 [phone]

## 2017-03-15 NOTE — ED Triage Notes (Signed)
Patient states that she found out she was pregnant for any unknown time period " maybe 8 weeks" _ patient has had pain to her pelvic region and some brown discharge x 1 - 2 weeks

## 2017-03-15 NOTE — Discharge Instructions (Signed)
Contact a health care provider if:  You have dizziness.  You have mild pelvic cramps, pelvic pressure, or nagging pain in the abdominal area.  You have persistent nausea, vomiting, or diarrhea.  You have a bad smelling vaginal discharge.  You have pain when you urinate.  You notice increased swelling in your face, hands, legs, or ankles.  You are exposed to fifth disease or chickenpox.  You are exposed to German measles (rubella) and have never had it.  Get help right away if:  You have a fever.  You are leaking fluid from your vagina.  You have spotting or bleeding from your vagina.  You have severe abdominal cramping or pain.  You have rapid weight gain or loss.  You vomit blood or material that looks like coffee grounds.  You develop a severe headache.  You have shortness of breath.  You have any kind of trauma, such as from a fall or a car accident.

## 2017-03-17 LAB — RPR: RPR: NONREACTIVE

## 2017-03-17 LAB — HIV ANTIBODY (ROUTINE TESTING W REFLEX): HIV SCREEN 4TH GENERATION: NONREACTIVE

## 2017-03-17 LAB — GC/CHLAMYDIA PROBE AMP (~~LOC~~) NOT AT ARMC
Chlamydia: NEGATIVE
NEISSERIA GONORRHEA: NEGATIVE

## 2018-01-31 ENCOUNTER — Encounter (HOSPITAL_COMMUNITY): Payer: Self-pay | Admitting: Emergency Medicine

## 2018-01-31 ENCOUNTER — Emergency Department (HOSPITAL_COMMUNITY): Payer: Managed Care, Other (non HMO)

## 2018-01-31 ENCOUNTER — Emergency Department (HOSPITAL_COMMUNITY)
Admission: EM | Admit: 2018-01-31 | Discharge: 2018-01-31 | Disposition: A | Discharge status: Home / Self Care | Payer: Managed Care, Other (non HMO) | Attending: Emergency Medicine | Admitting: Emergency Medicine

## 2018-01-31 ENCOUNTER — Other Ambulatory Visit: Payer: Self-pay

## 2018-01-31 DIAGNOSIS — R197 Diarrhea, unspecified: Secondary | ICD-10-CM | POA: Diagnosis not present

## 2018-01-31 DIAGNOSIS — F17210 Nicotine dependence, cigarettes, uncomplicated: Secondary | ICD-10-CM | POA: Diagnosis not present

## 2018-01-31 DIAGNOSIS — Z79899 Other long term (current) drug therapy: Secondary | ICD-10-CM | POA: Insufficient documentation

## 2018-01-31 DIAGNOSIS — R109 Unspecified abdominal pain: Secondary | ICD-10-CM | POA: Diagnosis present

## 2018-01-31 DIAGNOSIS — A09 Infectious gastroenteritis and colitis, unspecified: Secondary | ICD-10-CM

## 2018-01-31 LAB — CBC
HCT: 39.2 % (ref 36.0–46.0)
HEMOGLOBIN: 12.4 g/dL (ref 12.0–15.0)
MCH: 31.6 pg (ref 26.0–34.0)
MCHC: 31.6 g/dL (ref 30.0–36.0)
MCV: 100 fL (ref 78.0–100.0)
Platelets: 302 10*3/uL (ref 150–400)
RBC: 3.92 MIL/uL (ref 3.87–5.11)
RDW: 12.8 % (ref 11.5–15.5)
WBC: 5.4 10*3/uL (ref 4.0–10.5)

## 2018-01-31 LAB — COMPREHENSIVE METABOLIC PANEL
ALBUMIN: 3.7 g/dL (ref 3.5–5.0)
ALT: 11 U/L (ref 0–44)
ANION GAP: 10 (ref 5–15)
AST: 15 U/L (ref 15–41)
Alkaline Phosphatase: 71 U/L (ref 38–126)
BUN: 13 mg/dL (ref 6–20)
CALCIUM: 9.2 mg/dL (ref 8.9–10.3)
CO2: 23 mmol/L (ref 22–32)
Chloride: 106 mmol/L (ref 98–111)
Creatinine, Ser: 0.72 mg/dL (ref 0.44–1.00)
GFR calc Af Amer: >60 mL/min (ref >60)
GFR calc non Af Amer: >60 mL/min (ref >60)
GLUCOSE: 106 mg/dL — AB (ref 70–99)
POTASSIUM: 3.9 mmol/L (ref 3.5–5.1)
SODIUM: 139 mmol/L (ref 135–145)
Total Bilirubin: 0.6 mg/dL (ref 0.3–1.2)
Total Protein: 6.6 g/dL (ref 6.5–8.1)

## 2018-01-31 LAB — LIPASE, BLOOD: LIPASE: 58 U/L — AB (ref 11–51)

## 2018-01-31 LAB — I-STAT BETA HCG BLOOD, ED (MC, WL, AP ONLY)

## 2018-01-31 MED ORDER — SODIUM CHLORIDE 0.9 % IV BOLUS
2000.0000 mL | Freq: Once | INTRAVENOUS | Status: AC
  Administered 2018-01-31: 2000 mL via INTRAVENOUS

## 2018-01-31 MED ORDER — KETOROLAC TROMETHAMINE 30 MG/ML IJ SOLN
30.0000 mg | Freq: Once | INTRAMUSCULAR | Status: AC
  Administered 2018-01-31: 30 mg via INTRAVENOUS
  Filled 2018-01-31: qty 1

## 2018-01-31 MED ORDER — ONDANSETRON HCL 4 MG/2ML IJ SOLN
4.0000 mg | Freq: Once | INTRAMUSCULAR | Status: AC
  Administered 2018-01-31: 4 mg via INTRAVENOUS
  Filled 2018-01-31: qty 2

## 2018-01-31 MED ORDER — IOPAMIDOL (ISOVUE-300) INJECTION 61%
100.0000 mL | Freq: Once | INTRAVENOUS | Status: AC | PRN
  Administered 2018-01-31: 100 mL via INTRAVENOUS

## 2018-01-31 MED ORDER — IOPAMIDOL (ISOVUE-300) INJECTION 61%
INTRAVENOUS | Status: AC
  Filled 2018-01-31: qty 100

## 2018-01-31 NOTE — ED Triage Notes (Signed)
Pt states she has has generalized abdominal pain and left lower back pain for several days with nausea vomiting and diarrhea, has thrown up twice in past 24 hours. Pain 10/10. Pt delivered a baby 3 months ago and has implant birth control in arm. Denies blood in stool, denies urinary symptoms. Denies vaginal symptoms.

## 2018-01-31 NOTE — ED Provider Notes (Signed)
[facility] EMERGENCY DEPARTMENT Provider Note   CSN: [id number] Arrival date & time: 01/31/18  4098     History   Chief Complaint Chief Complaint  Patient presents with  . Abdominal Pain  . Diarrhea  . Emesis    HPI Amber Carrillo is a 32 y.o. female.  Patient complains of diarrhea abdominal cramping.  No blood in diarrhea  The history is provided by the patient. No language interpreter was used.  Abdominal Pain   This is a new problem. The current episode started yesterday. The problem occurs constantly. The problem has not changed since onset.The pain is associated with an unknown factor. The pain is located in the epigastric region. The pain is at a severity of 4/10. The pain is moderate. Associated symptoms include diarrhea and vomiting. Pertinent negatives include frequency, hematuria and headaches. Nothing aggravates the symptoms.  Diarrhea   Associated symptoms include abdominal pain and vomiting. Pertinent negatives include no headaches and no cough.  Emesis   Associated symptoms include abdominal pain and diarrhea. Pertinent negatives include no cough and no headaches.    History reviewed. No pertinent past medical history.  There are no active problems to display for this patient.   Past Surgical History:  Procedure Laterality Date  . CESAREAN SECTION    . NECK SURGERY       OB History    Gravida  1   Para      Term      Preterm      AB      Living        SAB      TAB      Ectopic      Multiple      Live Births               Home Medications    Prior to Admission medications   Medication Sig Start Date End Date Taking? Authorizing Provider  fexofenadine (ALLEGRA) 180 MG tablet Take 180 mg by mouth daily as needed for allergies.    Yes [provider]  naproxen sodium (ALEVE) 220 MG tablet Take 220 mg by mouth daily as needed (pain).    Yes [provider]  cephALEXin (KEFLEX) 500 MG capsule  Take 1 capsule (500 mg total) by mouth 3 (three) times daily. Patient not taking: Reported on 01/31/2018 01/25/17   Tilden Fossa, MD  gentamicin (GARAMYCIN) 0.3 % ophthalmic solution Place 1 drop into the left eye every 4 (four) hours. Patient not taking: Reported on 01/31/2018 07/27/16   Rolland Porter, MD  ibuprofen (ADVIL,MOTRIN) 800 MG tablet Take 1 tablet (800 mg total) by mouth every 8 (eight) hours as needed. Patient not taking: Reported on 01/31/2018 01/25/17   Tilden Fossa, MD  metroNIDAZOLE (FLAGYL) 500 MG tablet Take 1 tablet (500 mg total) by mouth 2 (two) times daily. One po bid x 7 days Patient not taking: Reported on 01/31/2018 02/26/16   Tomasita Crumble, MD    Family History No family history on file.  Social History Social History   Tobacco Use  . Smoking status: Current Every Day Smoker    Packs/day: 1.00    Types: Cigars  . Smokeless tobacco: Never Used  Substance Use Topics  . Alcohol use: Yes    Comment: occasional  . Drug use: No     Allergies   Patient has no known allergies.   Review of Systems Review of Systems  Constitutional: Negative for appetite change and  fatigue.  HENT: Negative for congestion, ear discharge and sinus pressure.   Eyes: Negative for discharge.  Respiratory: Negative for cough.   Cardiovascular: Negative for chest pain.  Gastrointestinal: Positive for abdominal pain, diarrhea and vomiting.  Genitourinary: Negative for frequency and hematuria.  Musculoskeletal: Negative for back pain.  Skin: Negative for rash.  Neurological: Negative for seizures and headaches.  Psychiatric/Behavioral: Negative for hallucinations.     Physical Exam Updated Vital Signs BP 113/81 (BP Location: Right Arm)   Pulse 67   Temp 98.1 F (36.7 C) (Oral)   Resp 18   LMP 02/01/2017 (LMP Unknown)   SpO2 100%   Physical Exam  Constitutional: She is oriented to person, place, and time. She appears well-developed.  HENT:  Head: Normocephalic.  Eyes:  Conjunctivae and EOM are normal. No scleral icterus.  Neck: Neck supple. No thyromegaly present.  Cardiovascular: Normal rate and regular rhythm. Exam reveals no gallop and no friction rub.  No murmur heard. Pulmonary/Chest: No stridor. She has no wheezes. She has no rales. She exhibits no tenderness.  Abdominal: She exhibits no distension. There is no tenderness. There is no rebound.  Minimal periumbilical tenderness  Musculoskeletal: Normal range of motion. She exhibits no edema.  Lymphadenopathy:    She has no cervical adenopathy.  Neurological: She is oriented to person, place, and time. She exhibits normal muscle tone. Coordination normal.  Skin: No rash noted. No erythema.  Psychiatric: She has a normal mood and affect. Her behavior is normal.     ED Treatments / Results  Labs (all labs ordered are listed, but only abnormal results are displayed) Labs Reviewed  LIPASE, BLOOD - Abnormal; Notable for the following components:      Result Value   Lipase 58 (*)    All other components within normal limits  COMPREHENSIVE METABOLIC PANEL - Abnormal; Notable for the following components:   Glucose, Bld 106 (*)    All other components within normal limits  CBC  I-STAT BETA HCG BLOOD, ED ([facility], [facility], [facility] ONLY)    EKG None  Radiology Ct Abdomen Pelvis W Contrast  Result Date: 01/31/2018 CLINICAL DATA:  Left upper abdominal and left back pain with nausea and vomiting for 3 days. EXAM: CT ABDOMEN AND PELVIS WITH CONTRAST TECHNIQUE: Multidetector CT imaging of the abdomen and pelvis was performed using the standard protocol following bolus administration of intravenous contrast. CONTRAST:  100 cc ISOVUE-300 IOPAMIDOL (ISOVUE-300) INJECTION 61% COMPARISON:  CT abdomen and pelvis 01/23/2018 11/16/2017. FINDINGS: Lower chest: Minimal dependent atelectasis is seen in the lung bases. No pleural or pericardial effusion. Heart size is normal. Hepatobiliary: No focal liver abnormality is seen. No  gallstones, gallbladder wall thickening, or biliary dilatation. Pancreas: Unremarkable. No pancreatic ductal dilatation or surrounding inflammatory changes. Spleen: Normal in size without focal abnormality. Adrenals/Urinary Tract: Adrenal glands are unremarkable. Kidneys are normal, without renal calculi, focal lesion, or hydronephrosis. Bladder is unremarkable. Stomach/Bowel: Stomach is within normal limits. Appendix appears normal. No evidence of bowel wall thickening, distention, or inflammatory changes. Vascular/Lymphatic: No significant vascular findings are present. No enlarged abdominal or pelvic lymph nodes. Reproductive: Uterus and bilateral adnexa are unremarkable. Other: Small fat containing supraumbilical hernia is unchanged in appearance. Musculoskeletal: Negative. IMPRESSION: No acute abnormality or finding to explain the patient's symptoms. No change in a small fat containing supraumbilical hernia. Electronically Signed   By: Drusilla Kanner M.D.   On: 01/31/2018 09:25    Procedures Procedures (including critical care time)  Medications Ordered in  ED Medications  sodium chloride 0.9 % bolus 2,000 mL (2,000 mLs Intravenous New Bag/Given 01/31/18 0818)  ondansetron (ZOFRAN) injection 4 mg (4 mg Intravenous Given 01/31/18 0818)  ketorolac (TORADOL) 30 MG/ML injection 30 mg (30 mg Intravenous Given 01/31/18 0818)  iopamidol (ISOVUE-300) 61 % injection 100 mL (100 mLs Intravenous Contrast Given 01/31/18 0901)     Initial Impression / Assessment and Plan / ED Course  I have reviewed the triage vital signs and the nursing notes.  Pertinent labs & imaging results that were available during my care of the patient were reviewed by me and considered in my medical decision making (see chart for details).    Patient with gastroenteritis.  Patient also has small abdominal hernia.  Patient will take Tylenol Motrin for pain drink plenty of fluids he will follow-up with her primary care doctor if any  problems.  She is also [facility] her her[facility]  Final Clinical Impressions(s) / ED Diagnoses   Final diagnoses:  Diarrhea of infectious origin    ED Discharge Orders    None       Bethann BerkshireZammit, Arlyss Weathersby, MD 01/31/18 1103

## 2018-01-31 NOTE — Discharge Instructions (Addendum)
Drink plenty of fluids and take Tylenol or Motrin for fever aches.  Follow-up with your primary care doctor if any problems.  You can follow-up with [facility] for your small hernia

## 2018-10-11 ENCOUNTER — Emergency Department (HOSPITAL_BASED_OUTPATIENT_CLINIC_OR_DEPARTMENT_OTHER)
Admission: EM | Admit: 2018-10-11 | Discharge: 2018-10-11 | Disposition: A | Discharge status: Home / Self Care | Payer: Managed Care, Other (non HMO) | Attending: Emergency Medicine | Admitting: Emergency Medicine

## 2018-10-11 ENCOUNTER — Encounter (HOSPITAL_BASED_OUTPATIENT_CLINIC_OR_DEPARTMENT_OTHER): Payer: Self-pay | Admitting: *Deleted

## 2018-10-11 ENCOUNTER — Emergency Department (HOSPITAL_BASED_OUTPATIENT_CLINIC_OR_DEPARTMENT_OTHER): Payer: Managed Care, Other (non HMO)

## 2018-10-11 ENCOUNTER — Other Ambulatory Visit: Payer: Self-pay

## 2018-10-11 DIAGNOSIS — R51 Headache: Secondary | ICD-10-CM | POA: Diagnosis not present

## 2018-10-11 DIAGNOSIS — Z87891 Personal history of nicotine dependence: Secondary | ICD-10-CM | POA: Insufficient documentation

## 2018-10-11 DIAGNOSIS — R519 Headache, unspecified: Secondary | ICD-10-CM

## 2018-10-11 LAB — CBC WITH DIFFERENTIAL/PLATELET
Abs Immature Granulocytes: 0.02 10*3/uL (ref 0.00–0.07)
Basophils Absolute: 0 10*3/uL (ref 0.0–0.1)
Basophils Relative: 0 %
Eosinophils Absolute: 0.1 10*3/uL (ref 0.0–0.5)
Eosinophils Relative: 1 %
HCT: 38.7 % (ref 36.0–46.0)
Hemoglobin: 12.3 g/dL (ref 12.0–15.0)
Immature Granulocytes: 0 %
Lymphocytes Relative: 23 %
Lymphs Abs: 2 10*3/uL (ref 0.7–4.0)
MCH: 30.7 pg (ref 26.0–34.0)
MCHC: 31.8 g/dL (ref 30.0–36.0)
MCV: 96.5 fL (ref 80.0–100.0)
Monocytes Absolute: 0.7 10*3/uL (ref 0.1–1.0)
Monocytes Relative: 7 %
Neutro Abs: 6.1 10*3/uL (ref 1.7–7.7)
Neutrophils Relative %: 69 %
Platelets: 307 10*3/uL (ref 150–400)
RBC: 4.01 MIL/uL (ref 3.87–5.11)
RDW: 13.9 % (ref 11.5–15.5)
WBC: 8.9 10*3/uL (ref 4.0–10.5)
nRBC: 0 % (ref 0.0–0.2)

## 2018-10-11 LAB — HCG, SERUM, QUALITATIVE: Preg, Serum: NEGATIVE

## 2018-10-11 LAB — BASIC METABOLIC PANEL
Anion gap: 7 (ref 5–15)
BUN: 9 mg/dL (ref 6–20)
CO2: 26 mmol/L (ref 22–32)
Calcium: 9.3 mg/dL (ref 8.9–10.3)
Chloride: 103 mmol/L (ref 98–111)
Creatinine, Ser: 0.58 mg/dL (ref 0.44–1.00)
GFR calc Af Amer: >60 mL/min (ref >60)
GFR calc non Af Amer: >60 mL/min (ref >60)
Glucose, Bld: 94 mg/dL (ref 70–99)
Potassium: 3.7 mmol/L (ref 3.5–5.1)
Sodium: 136 mmol/L (ref 135–145)

## 2018-10-11 MED ORDER — KETOROLAC TROMETHAMINE 30 MG/ML IJ SOLN
30.0000 mg | Freq: Once | INTRAMUSCULAR | Status: AC
  Administered 2018-10-11: 19:00:00 30 mg via INTRAVENOUS
  Filled 2018-10-11: qty 1

## 2018-10-11 MED ORDER — NAPROXEN 500 MG PO TABS: 500.0000 mg | ORAL_TABLET | Freq: Two times a day (BID) | ORAL | 0 refills | Status: DC

## 2018-10-11 MED ORDER — TRAMADOL HCL 50 MG PO TABS: 50.0000 mg | ORAL_TABLET | Freq: Four times a day (QID) | ORAL | 0 refills | Status: DC | PRN

## 2018-10-11 MED ORDER — DEXAMETHASONE SODIUM PHOSPHATE 10 MG/ML IJ SOLN
10.0000 mg | Freq: Once | INTRAMUSCULAR | Status: AC
  Administered 2018-10-11: 19:00:00 10 mg via INTRAVENOUS
  Filled 2018-10-11: qty 1

## 2018-10-11 MED ORDER — DIPHENHYDRAMINE HCL 50 MG/ML IJ SOLN
25.0000 mg | Freq: Once | INTRAMUSCULAR | Status: AC
  Administered 2018-10-11: 19:00:00 25 mg via INTRAVENOUS
  Filled 2018-10-11: qty 1

## 2018-10-11 MED ORDER — METOCLOPRAMIDE HCL 5 MG/ML IJ SOLN
10.0000 mg | Freq: Once | INTRAMUSCULAR | Status: AC
  Administered 2018-10-11: 19:00:00 10 mg via INTRAVENOUS
  Filled 2018-10-11: qty 2

## 2018-10-11 MED ORDER — SODIUM CHLORIDE 0.9 % IV BOLUS
1000.0000 mL | Freq: Once | INTRAVENOUS | Status: AC
  Administered 2018-10-11: 19:00:00 1000 mL via INTRAVENOUS

## 2018-10-11 NOTE — Discharge Instructions (Signed)
Begin taking naproxen twice daily as prescribed.  You can take tramadol as needed for pain not relieved with naproxen.  Follow-up with primary doctor if symptoms or not improving in the next week, and return to the ER if symptoms significantly worsen or change.

## 2018-10-11 NOTE — ED Triage Notes (Signed)
Pt reports intermittent headache for "weeks". Current episode 1.5 weeks in duration. Endorses nausea and neck pain. Ambulated to room 5 without need for assist

## 2018-10-11 NOTE — ED Provider Notes (Signed)
[facility] EMERGENCY DEPARTMENT Provider Note   CSN: [id number] Arrival date & time: 10/11/18  1830    History   Chief Complaint Chief Complaint  Patient presents with  . Headache    HPI Amber Carrillo is a 33 y.o. female.     Patient is a 33 year old female with no significant past medical history.  She presents today with complaints of headache.  This is been ongoing for the past 10 days.  She describes a pressure to the front of her head which has migrated to the back of her head and into her neck.  This is described as a throbbing sensation.  She denies any visual disturbances, weakness, or numbness.  She does feel nauseated, but has not vomited.  She denies much relief with Tylenol or ibuprofen.  The history is provided by the patient.  Headache  Pain location:  Frontal and occipital Quality: Throbbing. Radiates to:  Does not radiate Progression:  Worsening Chronicity:  New Relieved by:  Nothing Worsened by:  Activity and light Ineffective treatments:  Acetaminophen and NSAIDs Associated symptoms: no fever     History reviewed. No pertinent past medical history.  There are no active problems to display for this patient.   Past Surgical History:  Procedure Laterality Date  . CESAREAN SECTION    . NECK SURGERY       OB History    Gravida  1   Para      Term      Preterm      AB      Living        SAB      TAB      Ectopic      Multiple      Live Births               Home Medications    Prior to Admission medications   Medication Sig Start Date End Date Taking? Authorizing Provider  cephALEXin (KEFLEX) 500 MG capsule Take 1 capsule (500 mg total) by mouth 3 (three) times daily. Patient not taking: Reported on 01/31/2018 01/25/17   Tilden Fossaees, Elizabeth, MD  fexofenadine (ALLEGRA) 180 MG tablet Take 180 mg by mouth daily as needed for allergies.     [provider]  gentamicin (GARAMYCIN) 0.3 % ophthalmic solution Place 1  drop into the left eye every 4 (four) hours. Patient not taking: Reported on 01/31/2018 07/27/16   Rolland PorterJames, Mark, MD  ibuprofen (ADVIL,MOTRIN) 800 MG tablet Take 1 tablet (800 mg total) by mouth every 8 (eight) hours as needed. Patient not taking: Reported on 01/31/2018 01/25/17   Tilden Fossaees, Elizabeth, MD  metroNIDAZOLE (FLAGYL) 500 MG tablet Take 1 tablet (500 mg total) by mouth 2 (two) times daily. One po bid x 7 days Patient not taking: Reported on 01/31/2018 02/26/16   Tomasita Crumbleni, Adeleke, MD  naproxen sodium (ALEVE) 220 MG tablet Take 220 mg by mouth daily as needed (pain).     [provider]    Family History No family history on file.  Social History Social History   Tobacco Use  . Smoking status: Former Smoker    Packs/day: 1.00    Types: Cigars  . Smokeless tobacco: Never Used  Substance Use Topics  . Alcohol use: Not Currently    Comment: occasional  . Drug use: No     Allergies   Patient has no known allergies.   Review of Systems Review of Systems  Constitutional: Negative for chills and  fever.  Neurological: Positive for headaches.  All other systems reviewed and are negative.    Physical Exam Updated Vital Signs BP 109/82 (BP Location: Right Arm)   Pulse 88   Temp 98.6 F (37 C) (Oral)   Resp 18   Ht 5\' 6"  (1.676 m)   Wt 106.6 kg   SpO2 99%   Breastfeeding No   BMI 37.93 kg/m   Physical Exam Vitals signs and nursing note reviewed.  Constitutional:      General: She is not in acute distress.    Appearance: She is well-developed. She is not diaphoretic.  HENT:     Head: Normocephalic and atraumatic.  Eyes:     General: No visual field deficit.    Extraocular Movements: Extraocular movements intact.     Pupils: Pupils are equal, round, and reactive to light.     Comments: There is no papilledema on funduscopic exam  Neck:     Musculoskeletal: Normal range of motion and neck supple.  Cardiovascular:     Rate and Rhythm: Normal rate and regular  rhythm.     Heart sounds: No murmur. No friction rub. No gallop.   Pulmonary:     Effort: Pulmonary effort is normal. No respiratory distress.     Breath sounds: Normal breath sounds. No wheezing.  Abdominal:     General: Bowel sounds are normal. There is no distension.     Palpations: Abdomen is soft.     Tenderness: There is no abdominal tenderness.  Musculoskeletal: Normal range of motion.  Skin:    General: Skin is warm and dry.  Neurological:     Mental Status: She is alert and oriented to person, place, and time.     Cranial Nerves: No cranial nerve deficit or dysarthria.     Motor: No weakness.     Coordination: Coordination normal.      ED Treatments / Results  Labs (all labs ordered are listed, but only abnormal results are displayed) Labs Reviewed  BASIC METABOLIC PANEL  CBC WITH DIFFERENTIAL/PLATELET  HCG, SERUM, QUALITATIVE    EKG None  Radiology No results found.  Procedures Procedures (including critical care time)  Medications Ordered in ED Medications  sodium chloride 0.9 % bolus 1,000 mL (has no administration in time range)  ketorolac (TORADOL) 30 MG/ML injection 30 mg (has no administration in time range)  metoCLOPramide (REGLAN) injection 10 mg (has no administration in time range)  diphenhydrAMINE (BENADRYL) injection 25 mg (has no administration in time range)  dexamethasone (DECADRON) injection 10 mg (has no administration in time range)     Initial Impression / Assessment and Plan / ED Course  I have reviewed the triage vital signs and the nursing notes.  Pertinent labs & imaging results that were available during my care of the patient were reviewed by me and considered in my medical decision making (see chart for details).  Patient presents here with a 10-day history of persistent headache.  She has a normal neurologic exam and head CT which is negative.  Patient was given IV fluids and migraine cocktail and is feeling much better.  I  see nothing that appears emergent.  Patient will be discharged with anti-inflammatory medications, tramadol, and is to follow-up as needed if her symptoms worsen or change.  Final Clinical Impressions(s) / ED Diagnoses   Final diagnoses:  None    ED Discharge Orders    None       Geoffery Lyons, MD 10/11/18 2014

## 2019-05-12 ENCOUNTER — Other Ambulatory Visit: Payer: Self-pay

## 2019-05-12 ENCOUNTER — Encounter (HOSPITAL_BASED_OUTPATIENT_CLINIC_OR_DEPARTMENT_OTHER): Payer: Self-pay | Admitting: Emergency Medicine

## 2019-05-12 ENCOUNTER — Emergency Department (HOSPITAL_BASED_OUTPATIENT_CLINIC_OR_DEPARTMENT_OTHER)
Admission: EM | Admit: 2019-05-12 | Discharge: 2019-05-12 | Disposition: A | Discharge status: Home / Self Care | Payer: Managed Care, Other (non HMO) | Attending: Emergency Medicine | Admitting: Emergency Medicine

## 2019-05-12 DIAGNOSIS — H6502 Acute serous otitis media, left ear: Secondary | ICD-10-CM | POA: Insufficient documentation

## 2019-05-12 DIAGNOSIS — J069 Acute upper respiratory infection, unspecified: Secondary | ICD-10-CM | POA: Insufficient documentation

## 2019-05-12 DIAGNOSIS — F17210 Nicotine dependence, cigarettes, uncomplicated: Secondary | ICD-10-CM | POA: Diagnosis not present

## 2019-05-12 DIAGNOSIS — J029 Acute pharyngitis, unspecified: Secondary | ICD-10-CM | POA: Diagnosis present

## 2019-05-12 DIAGNOSIS — Z79899 Other long term (current) drug therapy: Secondary | ICD-10-CM | POA: Diagnosis not present

## 2019-05-12 MED ORDER — AMOXICILLIN-POT CLAVULANATE 875-125 MG PO TABS: 1.0000 | ORAL_TABLET | Freq: Two times a day (BID) | ORAL | 0 refills | Status: DC

## 2019-05-12 NOTE — Discharge Instructions (Signed)
Please read and follow all provided instructions.  Your diagnoses today include:  1. Non-recurrent acute serous otitis media of left ear   2. Viral upper respiratory tract infection     Tests performed today include:  Vital signs. See below for your results today.   Medications prescribed:   Augmentin - antibiotic  You have been prescribed an antibiotic medicine: take the entire course of medicine even if you are feeling better. Stopping early can cause the antibiotic not to work.  Take any prescribed medications only as directed.  Home care instructions:  Follow any educational materials contained in this packet.  BE VERY CAREFUL not to take multiple medicines containing Tylenol (also called acetaminophen). Doing so can lead to an overdose which can damage your liver and cause liver failure and possibly death.   Follow-up instructions: Please follow-up with your primary care provider in the next 3 days for further evaluation of your symptoms.   Return instructions:   Please return to the Emergency Department if you experience worsening symptoms.   Please return if you have any other emergent concerns.  Additional Information:  Your vital signs today were: BP 110/78 (BP Location: Right Arm)    Pulse 92    Temp 98.6 F (37 C) (Oral)    Resp 16    Ht 5' 6.25" (1.683 m)    Wt 96.4 kg    LMP 05/09/2019    SpO2 99%    BMI 34.04 kg/m  If your blood pressure (BP) was elevated above 135/85 this visit, please have this repeated by your doctor within one month. --------------

## 2019-05-12 NOTE — ED Triage Notes (Addendum)
Sore throat since yesterday.  Sinus congestion.  Pain and loss of hearing in left ear.

## 2019-05-12 NOTE — ED Provider Notes (Signed)
[facility] EMERGENCY DEPARTMENT Provider Note   CSN: [id number] Arrival date & time: 05/12/19  1224     History   Chief Complaint Chief Complaint  Patient presents with  . Sore Throat    HPI Amber Carrillo is a 33 y.o. female.     Patient with no significant past medical history presents the emergency department with 1 day history of sore throat, nasal congestion, and left ear pain.  Her most severe symptom is her left ear pain.  Denies any dental infections or tooth problems.  No fevers.  She continues to be able to taste and smell.  Denies any specific Covid contacts however works in [employer].  No cough, shortness of breath.  No treatments prior to arrival.  She had to leave work today due to her ear pain.     History reviewed. No pertinent past medical history.  There are no active problems to display for this patient.   Past Surgical History:  Procedure Laterality Date  . CESAREAN SECTION    . NECK SURGERY       OB History    Gravida  1   Para      Term      Preterm      AB      Living        SAB      TAB      Ectopic      Multiple      Live Births               Home Medications    Prior to Admission medications   Medication Sig Start Date End Date Taking? Authorizing Provider  amoxicillin-clavulanate (AUGMENTIN) 875-125 MG tablet Take 1 tablet by mouth every 12 (twelve) hours. 05/12/19   Renne Crigler, PA-C  fexofenadine (ALLEGRA) 180 MG tablet Take 180 mg by mouth daily as needed for allergies.     [provider]  naproxen (NAPROSYN) 500 MG tablet Take 1 tablet (500 mg total) by mouth 2 (two) times daily with a meal. 10/11/18   Geoffery Lyons, MD  naproxen sodium (ALEVE) 220 MG tablet Take 220 mg by mouth daily as needed (pain).     [provider]  traMADol (ULTRAM) 50 MG tablet Take 1 tablet (50 mg total) by mouth every 6 (six) hours as needed. 10/11/18   Geoffery Lyons, MD    Family History No family  history on file.  Social History Social History   Tobacco Use  . Smoking status: Current Every Day Smoker    Packs/day: 1.00    Types: Cigars  . Smokeless tobacco: Never Used  Substance Use Topics  . Alcohol use: Not Currently    Comment: occasional  . Drug use: No     Allergies   Patient has no known allergies.   Review of Systems Review of Systems  Constitutional: Negative for chills, fatigue and fever.  HENT: Positive for congestion, ear pain and sore throat. Negative for facial swelling, rhinorrhea and sinus pressure.   Eyes: Negative for redness.  Respiratory: Negative for cough and wheezing.   Gastrointestinal: Negative for abdominal pain, diarrhea, nausea and vomiting.  Genitourinary: Negative for dysuria.  Musculoskeletal: Negative for myalgias and neck stiffness.  Skin: Negative for rash.  Neurological: Negative for headaches.  Hematological: Negative for adenopathy.     Physical Exam Updated Vital Signs BP 110/78 (BP Location: Right Arm)   Pulse 92   Temp 98.6 F (37 C) (Oral)  Resp 16   Ht 5' 6.25" (1.683 m)   Wt 96.4 kg   LMP 05/09/2019   SpO2 99%   BMI 34.04 kg/m   Physical Exam Vitals signs and nursing note reviewed.  Constitutional:      Appearance: She is well-developed.  HENT:     Head: Normocephalic and atraumatic.     Jaw: No trismus.     Right Ear: Ear canal and external ear normal. No drainage or tenderness. No middle ear effusion. Tympanic membrane is not injected (+ dark TM), erythematous or bulging.     Left Ear: Ear canal and external ear normal. Tenderness (mild pain pulling on ear) present. No drainage. A middle ear effusion is present. Tympanic membrane is erythematous. Tympanic membrane is not bulging.     Nose: Congestion present. No mucosal edema or rhinorrhea.     Right Sinus: No maxillary sinus tenderness or frontal sinus tenderness.     Left Sinus: No maxillary sinus tenderness or frontal sinus tenderness.      Mouth/Throat:     Mouth: Mucous membranes are not dry. No oral lesions.     Pharynx: Uvula midline. Posterior oropharyngeal erythema (mild) present. No oropharyngeal exudate or uvula swelling.     Tonsils: No tonsillar abscesses.  Eyes:     General:        Right eye: No discharge.        Left eye: No discharge.     Conjunctiva/sclera: Conjunctivae normal.  Neck:     Musculoskeletal: Normal range of motion and neck supple.  Cardiovascular:     Rate and Rhythm: Normal rate and regular rhythm.     Heart sounds: Normal heart sounds.  Pulmonary:     Effort: Pulmonary effort is normal. No respiratory distress.     Breath sounds: Normal breath sounds. No wheezing or rales.  Abdominal:     Palpations: Abdomen is soft.     Tenderness: There is no abdominal tenderness.  Lymphadenopathy:     Cervical: No cervical adenopathy.  Skin:    General: Skin is warm and dry.  Neurological:     Mental Status: She is alert.      ED Treatments / Results  Labs (all labs ordered are listed, but only abnormal results are displayed) Labs Reviewed - No data to display  EKG None  Radiology No results found.  Procedures Procedures (including critical care time)  Medications Ordered in ED Medications - No data to display   Initial Impression / Assessment and Plan / ED Course  I have reviewed the triage vital signs and the nursing notes.  Pertinent labs & imaging results that were available during my care of the patient were reviewed by me and considered in my medical decision making (see chart for details).        Patient seen and examined.  Patient with symptoms consistent with URI, possible left-sided otitis media.  Will treat with Augmentin.  Recommended Sudafed or NSAIDs or other symptom control.  Work note given.  Encouraged return with worsening symptoms or other concerns.  Vital signs reviewed and are as follows: BP 110/78 (BP Location: Right Arm)   Pulse 92   Temp 98.6 F (37  C) (Oral)   Resp 16   Ht 5' 6.25" (1.683 m)   Wt 96.4 kg   LMP 05/09/2019   SpO2 99%   BMI 34.04 kg/m   Amber Carrillo was evaluated in Emergency Department on 05/12/2019 for the symptoms described in the history  of present illness. She was evaluated in the context of the global COVID-19 pandemic, which necessitated consideration that the patient might be at risk for infection with the SARS-CoV-2 virus that causes COVID-19. Institutional protocols and algorithms that pertain to the evaluation of patients at risk for COVID-19 are in a state of rapid change based on information released by regulatory bodies including the CDC and federal and state organizations. These policies and algorithms were followed during the patient's care in the ED.   Final Clinical Impressions(s) / ED Diagnoses   Final diagnoses:  Non-recurrent acute serous otitis media of left ear  Viral upper respiratory tract infection    Patient with upper respiratory infection symptoms.  Possible early left otitis media.  Ear canal and throat are slightly inflamed, doubt significant otitis externa or strep throat.  Doubt coronavirus.  Patient is otherwise well-appearing.  Will start treatment and supportive care at home.  Patient counseled return with worsening symptoms.  ED Discharge Orders         Ordered    amoxicillin-clavulanate (AUGMENTIN) 875-125 MG tablet  Every 12 hours     05/12/19 1308           Carlisle Cater, PA-C 05/12/19 1314    Virgel Manifold, MD 05/13/19 [phone]

## 2019-12-29 ENCOUNTER — Emergency Department (HOSPITAL_BASED_OUTPATIENT_CLINIC_OR_DEPARTMENT_OTHER)
Admission: EM | Admit: 2019-12-29 | Discharge: 2019-12-29 | Disposition: A | Discharge status: Home / Self Care | Payer: Managed Care, Other (non HMO) | Attending: Emergency Medicine | Admitting: Emergency Medicine

## 2019-12-29 ENCOUNTER — Other Ambulatory Visit: Payer: Self-pay

## 2019-12-29 ENCOUNTER — Encounter (HOSPITAL_BASED_OUTPATIENT_CLINIC_OR_DEPARTMENT_OTHER): Payer: Self-pay | Admitting: *Deleted

## 2019-12-29 DIAGNOSIS — F17290 Nicotine dependence, other tobacco product, uncomplicated: Secondary | ICD-10-CM | POA: Diagnosis not present

## 2019-12-29 DIAGNOSIS — R519 Headache, unspecified: Secondary | ICD-10-CM

## 2019-12-29 DIAGNOSIS — G44209 Tension-type headache, unspecified, not intractable: Secondary | ICD-10-CM | POA: Insufficient documentation

## 2019-12-29 LAB — PREGNANCY, URINE: Preg Test, Ur: NEGATIVE

## 2019-12-29 MED ORDER — KETOROLAC TROMETHAMINE 15 MG/ML IJ SOLN
15.0000 mg | Freq: Once | INTRAMUSCULAR | Status: AC
  Administered 2019-12-29: 15 mg via INTRAVENOUS
  Filled 2019-12-29: qty 1

## 2019-12-29 MED ORDER — DEXAMETHASONE SODIUM PHOSPHATE 10 MG/ML IJ SOLN
10.0000 mg | Freq: Once | INTRAMUSCULAR | Status: AC
  Administered 2019-12-29: 10 mg via INTRAVENOUS
  Filled 2019-12-29: qty 1

## 2019-12-29 MED ORDER — SODIUM CHLORIDE 0.9 % IV BOLUS
1000.0000 mL | Freq: Once | INTRAVENOUS | Status: AC
  Administered 2019-12-29: 1000 mL via INTRAVENOUS

## 2019-12-29 MED ORDER — PROCHLORPERAZINE EDISYLATE 10 MG/2ML IJ SOLN
10.0000 mg | Freq: Once | INTRAMUSCULAR | Status: AC
  Administered 2019-12-29: 10 mg via INTRAVENOUS
  Filled 2019-12-29: qty 2

## 2019-12-29 MED ORDER — DIPHENHYDRAMINE HCL 50 MG/ML IJ SOLN
25.0000 mg | Freq: Once | INTRAMUSCULAR | Status: AC
  Administered 2019-12-29: 25 mg via INTRAVENOUS
  Filled 2019-12-29: qty 1

## 2019-12-29 NOTE — ED Provider Notes (Signed)
[facility] EMERGENCY DEPARTMENT Provider Note   CSN: [id number] Arrival date & time: 12/29/19  6073     History Chief Complaint  Patient presents with  . Headache    Amber Carrillo is a 34 y.o. female.  The history is provided by the patient.  Headache Pain location:  Frontal Radiates to:  Does not radiate Onset quality:  Gradual Duration:  7 days Timing:  Constant Progression:  Unable to specify Chronicity:  Recurrent Context: bright light   Relieved by:  NSAIDs Worsened by:  Light Associated symptoms: no abdominal pain, no back pain, no blurred vision, no cough, no dizziness, no ear pain, no eye pain, no fever, no focal weakness, no numbness, no paresthesias, no seizures, no sore throat, no visual change, no vomiting and no weakness        History reviewed. No pertinent past medical history.  There are no problems to display for this patient.   Past Surgical History:  Procedure Laterality Date  . CESAREAN SECTION    . NECK SURGERY       OB History    Gravida  2   Para  2   Term      Preterm      AB      Living        SAB      TAB      Ectopic      Multiple      Live Births              No family history on file.  Social History   Tobacco Use  . Smoking status: Current Every Day Smoker    Packs/day: 1.00    Types: Cigars  . Smokeless tobacco: Never Used  Vaping Use  . Vaping Use: Never used  Substance Use Topics  . Alcohol use: Yes    Comment: occasional  . Drug use: No    Home Medications Prior to Admission medications   Medication Sig Start Date End Date Taking? Authorizing Provider  naproxen sodium (ALEVE) 220 MG tablet Take 220 mg by mouth daily as needed (pain).     [provider]    Allergies    Patient has no known allergies.  Review of Systems   Review of Systems  Constitutional: Negative for chills and fever.  HENT: Negative for ear pain and sore throat.   Eyes: Negative for blurred  vision, pain and visual disturbance.  Respiratory: Negative for cough and shortness of breath.   Cardiovascular: Negative for chest pain and palpitations.  Gastrointestinal: Negative for abdominal pain and vomiting.  Genitourinary: Negative for dysuria and hematuria.  Musculoskeletal: Negative for arthralgias and back pain.  Skin: Negative for color change and rash.  Neurological: Positive for headaches. Negative for dizziness, tremors, focal weakness, seizures, syncope, facial asymmetry, speech difficulty, weakness, light-headedness, numbness and paresthesias.  All other systems reviewed and are negative.   Physical Exam  ED Triage Vitals  Enc Vitals Group     BP 12/29/19 0942 118/72     Pulse Rate 12/29/19 0942 90     Resp 12/29/19 0942 18     Temp 12/29/19 0942 98.6 F (37 C)     Temp Source 12/29/19 0942 Oral     SpO2 12/29/19 0942 100 %     Weight 12/29/19 0942 200 lb (90.7 kg)     Height 12/29/19 0942 5\' 6"  (1.676 m)     Head Circumference --  Peak Flow --      Pain Score 12/29/19 0946 10     Pain Loc --      Pain Edu? --      Excl. in GC? --     Physical Exam Vitals and nursing note reviewed.  Constitutional:      General: She is not in acute distress.    Appearance: She is well-developed.  HENT:     Head: Normocephalic and atraumatic.  Eyes:     Extraocular Movements: Extraocular movements intact.     Conjunctiva/sclera: Conjunctivae normal.     Pupils: Pupils are equal, round, and reactive to light.  Cardiovascular:     Rate and Rhythm: Normal rate and regular rhythm.     Heart sounds: No murmur heard.   Pulmonary:     Effort: Pulmonary effort is normal. No respiratory distress.     Breath sounds: Normal breath sounds.  Abdominal:     Palpations: Abdomen is soft.     Tenderness: There is no abdominal tenderness.  Musculoskeletal:     Cervical back: Neck supple.  Skin:    General: Skin is warm and dry.  Neurological:     General: No focal deficit  present.     Mental Status: She is alert and oriented to person, place, and time.     Cranial Nerves: No cranial nerve deficit.     Sensory: No sensory deficit.     Motor: No weakness.     Coordination: Coordination normal.     Gait: Gait normal.     Comments: 5+ out of 5 strength throughout, normal sensation, normal gait, normal finger-to-nose finger, normal speech     ED Results / Procedures / Treatments   Labs (all labs ordered are listed, but only abnormal results are displayed) Labs Reviewed  PREGNANCY, URINE    EKG None  Radiology No results found.  Procedures Procedures (including critical care time)  Medications Ordered in ED Medications  prochlorperazine (COMPAZINE) injection 10 mg (10 mg Intravenous Given 12/29/19 1008)  diphenhydrAMINE (BENADRYL) injection 25 mg (25 mg Intravenous Given 12/29/19 1008)  sodium chloride 0.9 % bolus 1,000 mL (1,000 mLs Intravenous New Bag/Given 12/29/19 1005)  ketorolac (TORADOL) 15 MG/ML injection 15 mg (15 mg Intravenous Given 12/29/19 1014)  dexamethasone (DECADRON) injection 10 mg (10 mg Intravenous Given 12/29/19 1014)    ED Course  I have reviewed the triage vital signs and the nursing notes.  Pertinent labs & imaging results that were available during my care of the patient were reviewed by me and considered in my medical decision making (see chart for details).    MDM Rules/Calculators/A&P                          Amber Carrillo is a 34 year old female who presents to the ED with headache.  Patient with normal vitals.  No fever.  Headache for the last week with minimal relief with over-the-counter medications at home.  Similar to headaches in the past.  Normal neurological exam.  No concern for meningitis or other intracranial process.  Will give IV migraine headache cocktail, fluid bolus.  Following headache cocktail with IV Benadryl, Compazine, Decadron, Toradol headache has improved greatly.  Discharged back to home.   Understands return precautions.  This chart was dictated using voice recognition software.  Despite best efforts to proofread,  errors can occur which can change the documentation meaning.   Final Clinical Impression(s) / ED Diagnoses  Final diagnoses:  Nonintractable headache, unspecified chronicity pattern, unspecified headache type    Rx / DC Orders ED Discharge Orders    None       Virgina Norfolk, DO 12/29/19 1102

## 2019-12-29 NOTE — ED Notes (Signed)
ED Provider at bedside. 

## 2019-12-29 NOTE — ED Triage Notes (Signed)
Headache since Wednesday

## 2020-05-16 ENCOUNTER — Emergency Department (HOSPITAL_BASED_OUTPATIENT_CLINIC_OR_DEPARTMENT_OTHER)
Admission: EM | Admit: 2020-05-16 | Discharge: 2020-05-16 | Disposition: A | Discharge status: Home / Self Care | Payer: Managed Care, Other (non HMO) | Attending: Emergency Medicine | Admitting: Emergency Medicine

## 2020-05-16 ENCOUNTER — Encounter (HOSPITAL_BASED_OUTPATIENT_CLINIC_OR_DEPARTMENT_OTHER): Payer: Self-pay | Admitting: *Deleted

## 2020-05-16 ENCOUNTER — Other Ambulatory Visit: Payer: Self-pay

## 2020-05-16 DIAGNOSIS — J029 Acute pharyngitis, unspecified: Secondary | ICD-10-CM | POA: Insufficient documentation

## 2020-05-16 DIAGNOSIS — F17290 Nicotine dependence, other tobacco product, uncomplicated: Secondary | ICD-10-CM | POA: Insufficient documentation

## 2020-05-16 DIAGNOSIS — Z20822 Contact with and (suspected) exposure to covid-19: Secondary | ICD-10-CM | POA: Insufficient documentation

## 2020-05-16 LAB — RESP PANEL BY RT-PCR (FLU A&B, COVID) ARPGX2
Influenza A by PCR: NEGATIVE
Influenza B by PCR: NEGATIVE
SARS Coronavirus 2 by RT PCR: NEGATIVE

## 2020-05-16 LAB — GROUP A STREP BY PCR: Group A Strep by PCR: NOT DETECTED

## 2020-05-16 MED ORDER — BENZONATATE 100 MG PO CAPS: 100.0000 mg | ORAL_CAPSULE | Freq: Three times a day (TID) | ORAL | 0 refills | Status: AC

## 2020-05-16 MED ORDER — DEXAMETHASONE 6 MG PO TABS
10.0000 mg | ORAL_TABLET | Freq: Once | ORAL | Status: AC
  Administered 2020-05-16: 10 mg via ORAL
  Filled 2020-05-16: qty 1

## 2020-05-16 NOTE — ED Provider Notes (Signed)
[facility] EMERGENCY DEPARTMENT Provider Note   CSN: [id number] Arrival date & time: 05/16/20  1858     History Chief Complaint  Patient presents with  . Sore Throat    Amber Carrillo is a 34 y.o. female.  34 yo F with a chief complaints of cough and sore throat.  This is been going on for about a week now.  Patient was around a friend who tested positive for the coronavirus and so she is concerned about the same.  No difficulty breathing.  Eating and drinking normally.  No nausea vomiting or diarrhea.  No abdominal pain.  The history is provided by the patient.  Sore Throat Pertinent negatives include no chest pain, no headaches and no shortness of breath.  Illness Severity:  Moderate Onset quality:  Gradual Duration:  1 week Timing:  Constant Progression:  Worsening Chronicity:  New Associated symptoms: congestion, cough and sore throat   Associated symptoms: no chest pain, no fever, no headaches, no myalgias, no nausea, no rhinorrhea, no shortness of breath, no vomiting and no wheezing        History reviewed. No pertinent past medical history.  There are no problems to display for this patient.   Past Surgical History:  Procedure Laterality Date  . CESAREAN SECTION    . NECK SURGERY       OB History    Gravida  2   Para  2   Term      Preterm      AB      Living        SAB      IAB      Ectopic      Multiple      Live Births              No family history on file.  Social History   Tobacco Use  . Smoking status: Current Every Day Smoker    Packs/day: 1.00    Types: Cigars  . Smokeless tobacco: Never Used  Vaping Use  . Vaping Use: Never used  Substance Use Topics  . Alcohol use: Yes    Comment: occasional  . Drug use: No    Home Medications Prior to Admission medications   Medication Sig Start Date End Date Taking? Authorizing Provider  benzonatate (TESSALON) 100 MG capsule Take 1 capsule (100 mg total) by  mouth every 8 (eight) hours. 05/16/20   Melene Plan, DO  naproxen sodium (ALEVE) 220 MG tablet Take 220 mg by mouth daily as needed (pain).     [provider]    Allergies    Patient has no known allergies.  Review of Systems   Review of Systems  Constitutional: Negative for chills and fever.  HENT: Positive for congestion and sore throat. Negative for rhinorrhea.   Eyes: Negative for redness and visual disturbance.  Respiratory: Positive for cough. Negative for shortness of breath and wheezing.   Cardiovascular: Negative for chest pain and palpitations.  Gastrointestinal: Negative for nausea and vomiting.  Genitourinary: Negative for dysuria and urgency.  Musculoskeletal: Negative for arthralgias and myalgias.  Skin: Negative for pallor and wound.  Neurological: Negative for dizziness and headaches.    Physical Exam Updated Vital Signs BP 119/87   Pulse 97   Temp 98.8 F (37.1 C) (Oral)   Resp 16   Ht 5\' 6"  (1.676 m)   Wt 90.7 kg   SpO2 100%   BMI 32.27 kg/m   Physical  Exam Vitals and nursing note reviewed.  Constitutional:      General: She is not in acute distress.    Appearance: She is well-developed and well-nourished. She is not diaphoretic.  HENT:     Head: Normocephalic and atraumatic.     Comments: Mild tonsillar swelling with pustule on the left tonsillar pillar.  Mildly diffuse anterior neck pain.  No crepitus.  Able to range her neck without issue.  Tolerating secretions without difficulty.  Swollen turbinates, posterior nasal drip, no noted sinus ttp, tm normal bilaterally.      Mouth/Throat:     Tonsils: 1+ on the left.  Eyes:     Extraocular Movements: EOM normal.     Pupils: Pupils are equal, round, and reactive to light.  Cardiovascular:     Rate and Rhythm: Normal rate and regular rhythm.     Heart sounds: No murmur heard. No friction rub. No gallop.   Pulmonary:     Effort: Pulmonary effort is normal.     Breath sounds: No wheezing  or rales.  Abdominal:     General: There is no distension.     Palpations: Abdomen is soft.     Tenderness: There is no abdominal tenderness.  Musculoskeletal:        General: No tenderness or edema.     Cervical back: Normal range of motion and neck supple.  Skin:    General: Skin is warm and dry.  Neurological:     Mental Status: She is alert and oriented to person, place, and time.  Psychiatric:        Mood and Affect: Mood and affect normal.        Behavior: Behavior normal.     ED Results / Procedures / Treatments   Labs (all labs ordered are listed, but only abnormal results are displayed) Labs Reviewed  RESP PANEL BY RT-PCR (FLU A&B, COVID) ARPGX2  GROUP A STREP BY PCR    EKG None  Radiology No results found.  Procedures Procedures (including critical care time)  Medications Ordered in ED Medications  dexamethasone (DECADRON) tablet 10 mg (10 mg Oral Given 05/16/20 2227)    ED Course  I have reviewed the triage vital signs and the nursing notes.  Pertinent labs & imaging results that were available during my care of the patient were reviewed by me and considered in my medical decision making (see chart for details).    MDM Rules/Calculators/A&P                          34 yo F with a chief complaints of cough and sore throat after a Covid exposure.  No hypoxia.  Clear lung sounds.  Rapid strep negative.  Covid test and flu were also negative.  Will discharge the patient home.  PCP follow-up.  Amber Carrillo was evaluated in Emergency Department on 05/16/2020 for the symptoms described in the history of present illness. He/she was evaluated in the context of the global COVID-19 pandemic, which necessitated consideration that the patient might be at risk for infection with the SARS-CoV-2 virus that causes COVID-19. Institutional protocols and algorithms that pertain to the evaluation of patients at risk for COVID-19 are in a state of rapid change based on  information released by regulatory bodies including the CDC and federal and state organizations. These policies and algorithms were followed during the patient's care in the ED.  10:30 PM:  I have discussed the diagnosis/risks/treatment  options with the patient and believe the pt to be eligible for discharge home to follow-up with PCP. We also discussed returning to the ED immediately if new or worsening sx occur. We discussed the sx which are most concerning (e.g., sudden worsening pain, fever, inability to tolerate by mouth) that necessitate immediate return. Medications administered to the patient during their visit and any new prescriptions provided to the patient are listed below.  Medications given during this visit Medications  dexamethasone (DECADRON) tablet 10 mg (10 mg Oral Given 05/16/20 2227)     The patient appears reasonably screen and/or stabilized for discharge and I doubt any other medical condition or other [facility] requiring further screening, evaluation, or treatment in the ED at this time prior to discharge.   Final Clinical Impression(s) / ED Diagnoses Final diagnoses:  Pharyngitis, unspecified etiology    Rx / DC Orders ED Discharge Orders         Ordered    benzonatate (TESSALON) 100 MG capsule  Every 8 hours        05/16/20 2226           Melene Plan, DO 05/16/20 2230

## 2020-05-16 NOTE — Discharge Instructions (Signed)
Take tylenol 2 pills 4 times a day and motrin 4 pills 3 times a day.  Drink plenty of fluids.  Return for worsening shortness of breath, headache, confusion. Follow up with your family doctor.   

## 2020-05-16 NOTE — ED Triage Notes (Signed)
Sore throat, ear pain, cough, runny nose, nasal congestion for a week.

## 2020-11-19 IMAGING — CT CT HEAD WITHOUT CONTRAST
3 series · 16 of 47 positions shown, 19 images · non-contrast
Comparison: 05/15/2016

CLINICAL DATA: Headaches for several weeks

EXAM:
CT HEAD WITHOUT CONTRAST
TECHNIQUE: Contiguous axial images were obtained from the base of the skull
through the vertex without intravenous contrast.

[Series 2: head wo · axial · 0.46mm/px · z∈[-389,-234]mm · 10 of 37 slices shown, 13 images]
[im 3/37  brain]
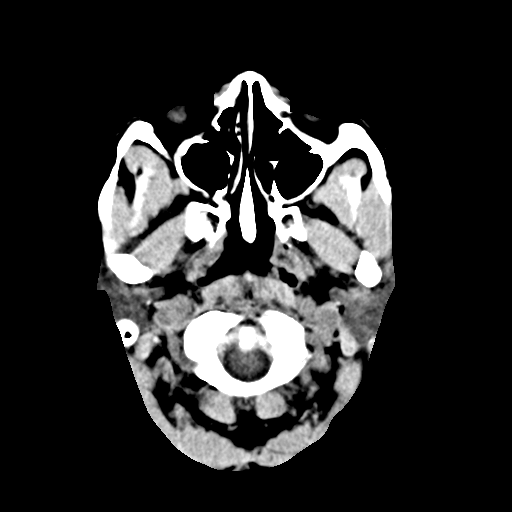
[im 3/37  bone]
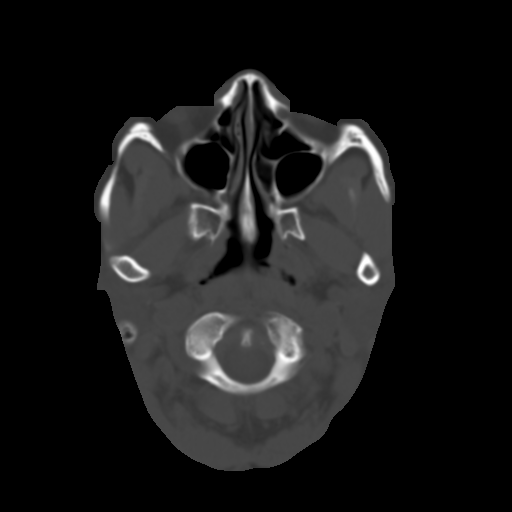
[im 7/37  brain]
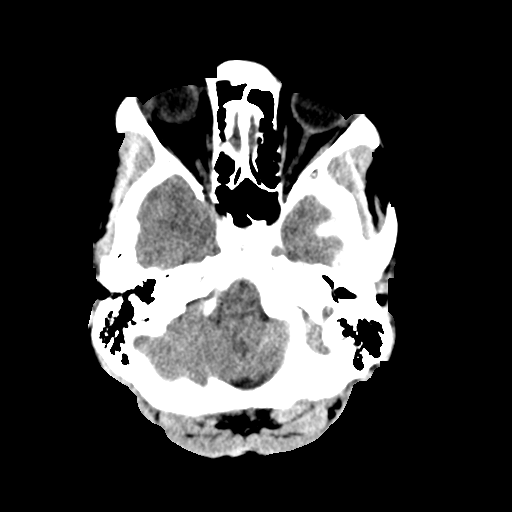
[im 10/37  brain]
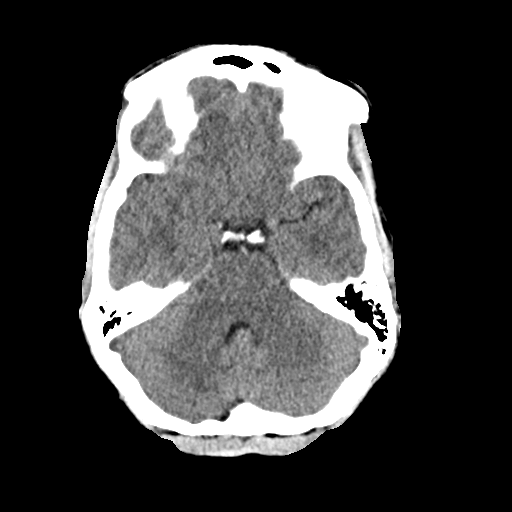
[im 13/37  brain]
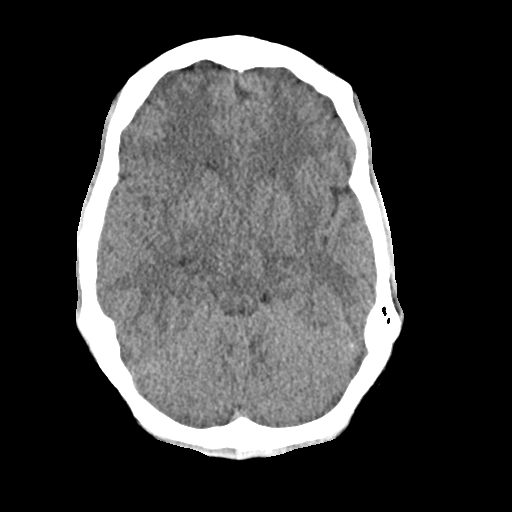
[im 17/37  brain]
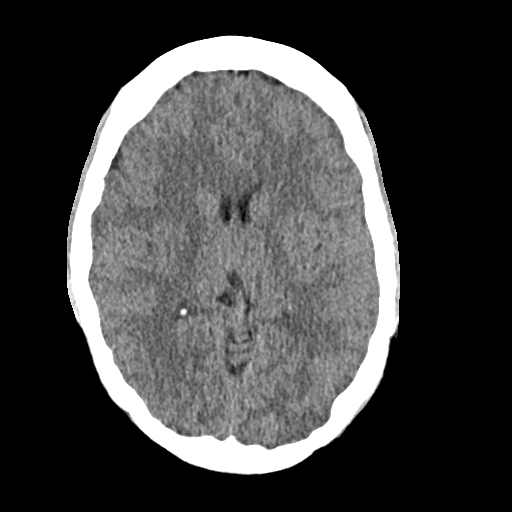
[im 17/37  bone]
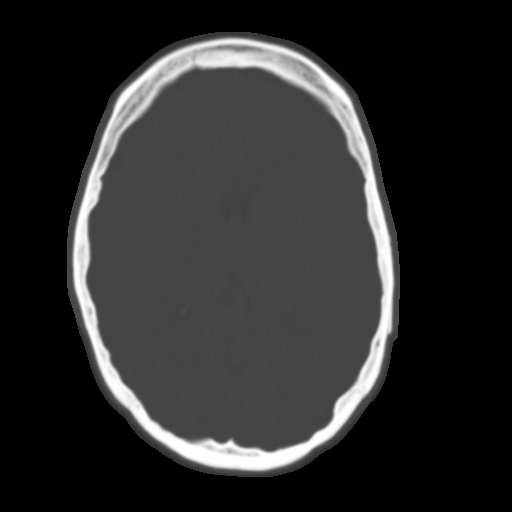
[im 20/37  brain]
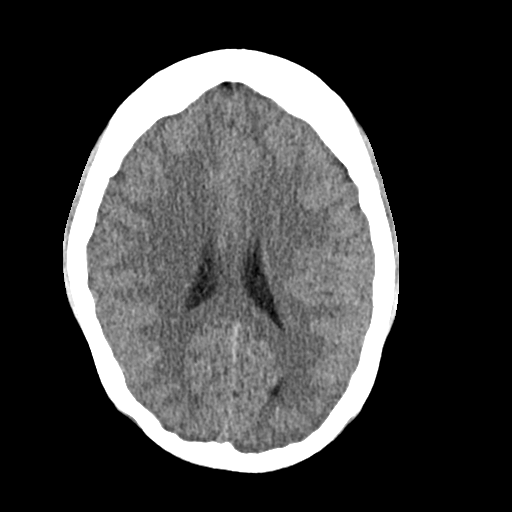
[im 24/37  brain]
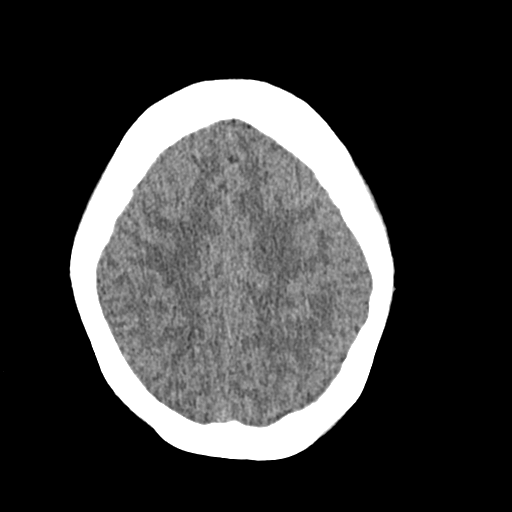
[im 28/37  brain]
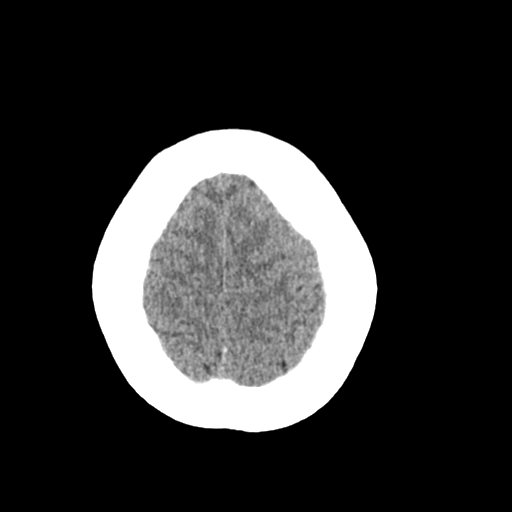
[im 30/37  brain]
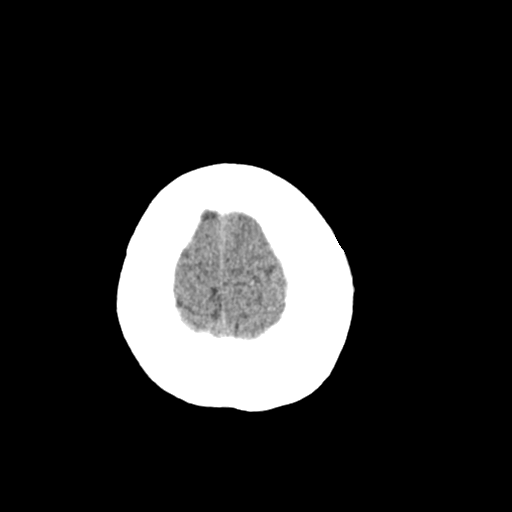
[im 30/37  bone]
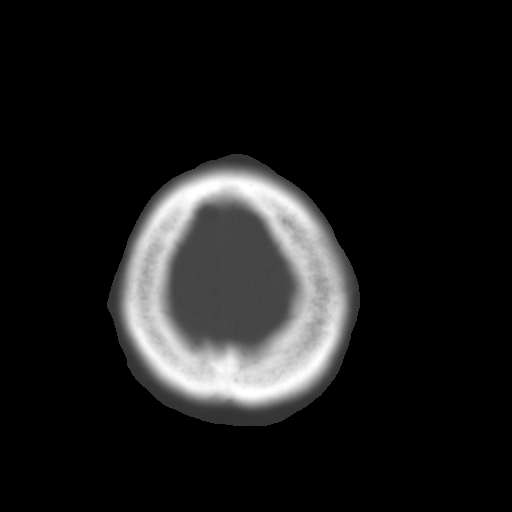
[im 34/37  brain]
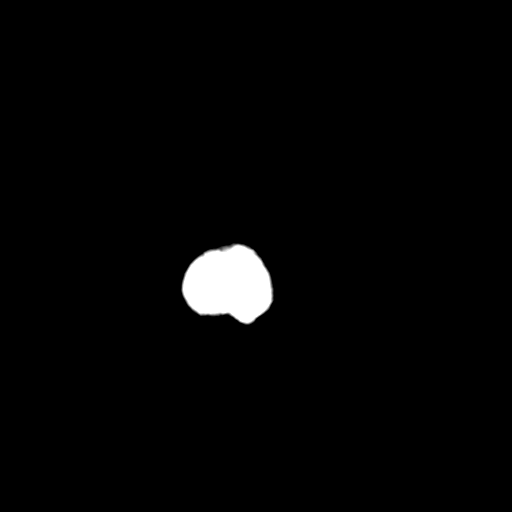

[Series 4: coronal soft · coronal · 0.37mm/px · 3 of 74 slices shown]
[im 25/74  brain]
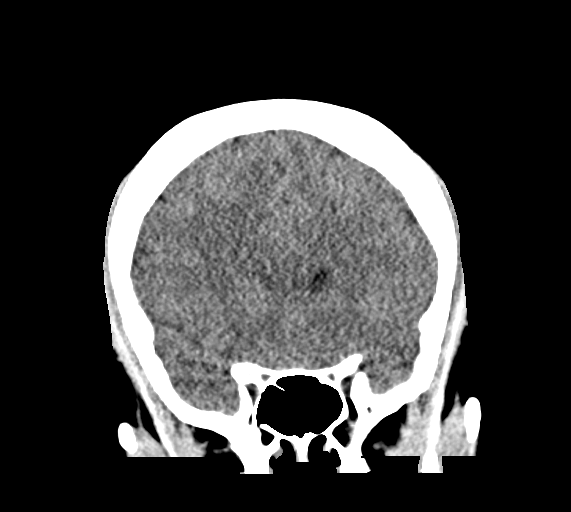
[im 33/74  brain]
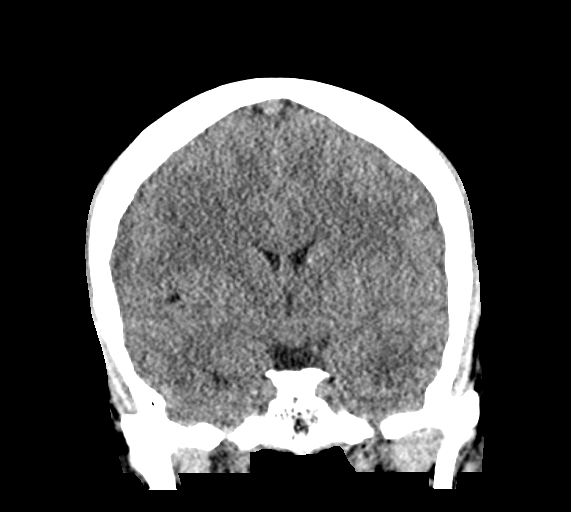
[im 41/74  brain]
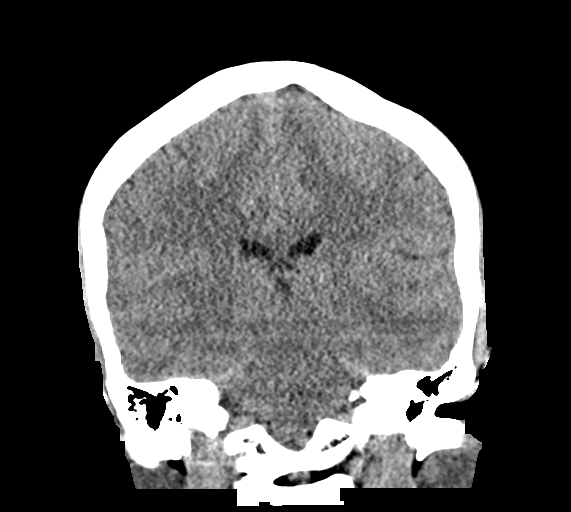

[Series 5: sag soft · sagittal · 0.38mm/px · 3 of 56 slices shown]
[im 19/56  brain]
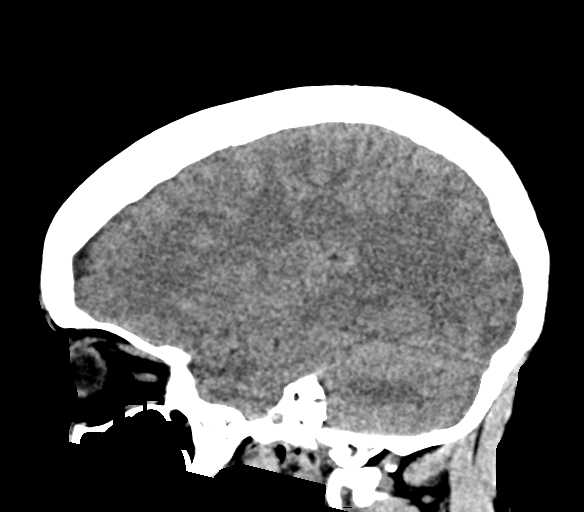
[im 28/56  brain]
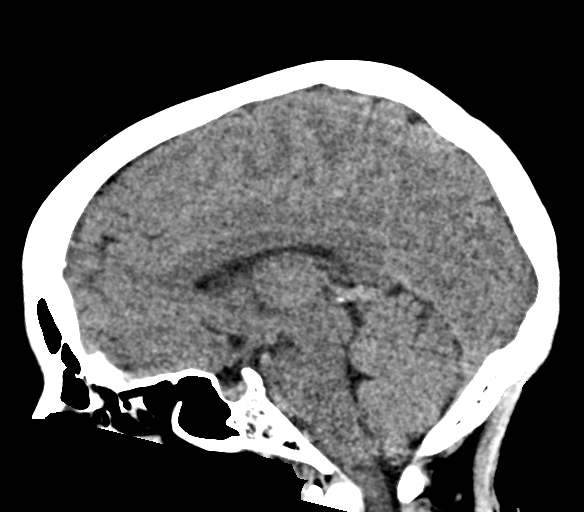
[im 37/56  brain]
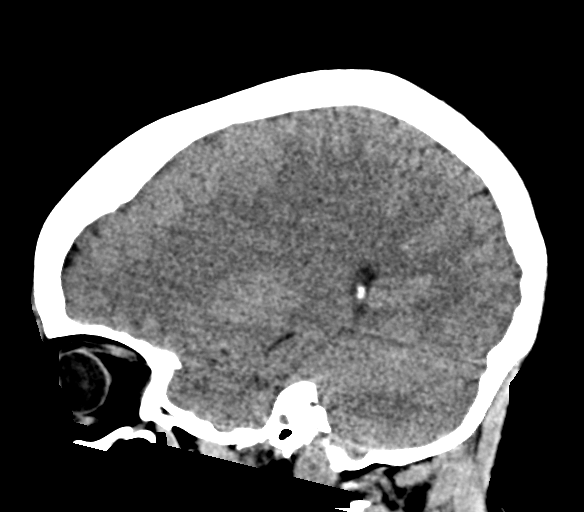

[16 of 47 positions shown; findings below may reference images not displayed]

FINDINGS: Brain: No evidence of acute infarction, hemorrhage, hydrocephalus,
extra-axial collection or mass lesion/mass effect.

Vascular: No hyperdense vessel or unexpected calcification.

Skull: Normal. Negative for fracture or focal lesion.

Sinuses/Orbits: No acute finding.

Other: None.
IMPRESSION: No acute abnormality noted.

## 2021-04-18 ENCOUNTER — Emergency Department (HOSPITAL_BASED_OUTPATIENT_CLINIC_OR_DEPARTMENT_OTHER)
Admission: EM | Admit: 2021-04-18 | Discharge: 2021-04-18 | Disposition: A | Discharge status: Home / Self Care | Payer: BC Managed Care – PPO | Attending: Emergency Medicine | Admitting: Emergency Medicine

## 2021-04-18 ENCOUNTER — Other Ambulatory Visit: Payer: Self-pay

## 2021-04-18 ENCOUNTER — Emergency Department (HOSPITAL_BASED_OUTPATIENT_CLINIC_OR_DEPARTMENT_OTHER): Payer: BC Managed Care – PPO

## 2021-04-18 ENCOUNTER — Encounter (HOSPITAL_BASED_OUTPATIENT_CLINIC_OR_DEPARTMENT_OTHER): Payer: Self-pay | Admitting: Emergency Medicine

## 2021-04-18 DIAGNOSIS — M79605 Pain in left leg: Secondary | ICD-10-CM | POA: Diagnosis present

## 2021-04-18 DIAGNOSIS — F17210 Nicotine dependence, cigarettes, uncomplicated: Secondary | ICD-10-CM | POA: Diagnosis not present

## 2021-04-18 MED ORDER — NAPROXEN 500 MG PO TABS: 500.0000 mg | ORAL_TABLET | Freq: Two times a day (BID) | ORAL | 0 refills | Status: AC | PRN

## 2021-04-18 MED ORDER — METHOCARBAMOL 500 MG PO TABS: 500.0000 mg | ORAL_TABLET | Freq: Two times a day (BID) | ORAL | 0 refills | Status: AC | PRN

## 2021-04-18 NOTE — ED Provider Notes (Signed)
[facility] EMERGENCY DEPARTMENT Provider Note   CSN: [id number] Arrival date & time: 04/18/21  0941     History Chief Complaint  Patient presents with   Leg Pain    Amber Carrillo is a 35 y.o. female.  HPI Patient is a 35 year old female who presents to the emergency department with left calf pain and swelling for the past week.  States her symptoms have been waxing and waning.  States that she reports increased warmth in the region and that "it feels tight".  No history of similar symptoms.  Denies a history of blood clots, recent injury, recent immobilization, recent travel.  She has a Nexplanon implant but is not on any oral estrogen.  She smokes about 1 pack/week.  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  She denies any chest pain, shortness of breath, fevers, chills, nausea, vomiting.    No past medical history on file.  There are no problems to display for this patient.   Past Surgical History:  Procedure Laterality Date   CESAREAN SECTION     NECK SURGERY       OB History     Gravida  2   Para  2   Term      Preterm      AB      Living         SAB      IAB      Ectopic      Multiple      Live Births              No family history on file.  Social History   Tobacco Use   Smoking status: Every Day    Packs/day: 1.00    Types: Cigars, Cigarettes   Smokeless tobacco: Never  Vaping Use   Vaping Use: Never used  Substance Use Topics   Alcohol use: Yes    Comment: occasional   Drug use: No    Home Medications Prior to Admission medications   Medication Sig Start Date End Date Taking? Authorizing Provider  methocarbamol (ROBAXIN) 500 MG tablet Take 1 tablet (500 mg total) by mouth 2 (two) times daily as needed for muscle spasms. 04/18/21  Yes Placido Sou, PA-C  naproxen (NAPROSYN) 500 MG tablet Take 1 tablet (500 mg total) by mouth 2 (two) times daily as  needed. 04/18/21  Yes Placido Sou, PA-C  benzonatate (TESSALON) 100 MG capsule Take 1 capsule (100 mg total) by mouth every 8 (eight) hours. 05/16/20   Melene Plan, DO    Allergies    Patient has no known allergies.  Review of Systems   Review of Systems  All other systems reviewed and are negative. Ten systems reviewed and are negative for acute change, except as noted in the HPI.   Physical Exam Updated Vital Signs BP 125/85   Pulse 95   Temp 99.1 F (37.3 C) (Oral)   Resp 16   Ht 5\' 6"  (1.676 m)   Wt 106.6 kg   LMP 03/25/2021   SpO2 100%   BMI 37.93 kg/m   Physical Exam Vitals and nursing note reviewed.  Constitutional:      General: She is not in acute distress.    Appearance: Normal appearance. She is not ill-appearing, toxic-appearing or diaphoretic.  HENT:     Head: Normocephalic and atraumatic.     Right Ear: External ear normal.  Left Ear: External ear normal.     Nose: Nose normal.     Mouth/Throat:     Mouth: Mucous membranes are moist.     Pharynx: Oropharynx is clear. No oropharyngeal exudate or posterior oropharyngeal erythema.  Eyes:     Extraocular Movements: Extraocular movements intact.  Cardiovascular:     Rate and Rhythm: Normal rate and regular rhythm.     Pulses: Normal pulses.     Heart sounds: Normal heart sounds. No murmur heard.   No friction rub. No gallop.  Pulmonary:     Effort: Pulmonary effort is normal. No respiratory distress.     Breath sounds: Normal breath sounds. No stridor. No wheezing, rhonchi or rales.  Abdominal:     General: Abdomen is flat. There is no distension.     Palpations: There is no mass.  Musculoskeletal:        General: Tenderness present. Normal range of motion.     Cervical back: Normal range of motion and neck supple. No tenderness.     Comments: Mild tenderness noted diffusely in the left calf that appears to be worst along the left lateral calf.  No palpable cords.  No edema appreciated in the  region.  2+ DP pulses.  Distal sensation intact.  Skin:    General: Skin is warm and dry.  Neurological:     General: No focal deficit present.     Mental Status: She is alert and oriented to person, place, and time.  Psychiatric:        Mood and Affect: Mood normal.        Behavior: Behavior normal.   ED Results / Procedures / Treatments   Labs (all labs ordered are listed, but only abnormal results are displayed) Labs Reviewed - No data to display  EKG None  Radiology US Venous Img Lower Unilateral Left  Result Date: 04/18/2021 CLINICAL DATA:  Left lower extremity pain and edema for the past week. Evaluate for DVT. EXAM: LEFT LOWER EXTREMITY VENOUS DOPPLER ULTRASOUND TECHNIQUE: Gray-scale sonography with graded compression, as well as color Doppler and duplex ultrasound were performed to evaluate the lower extremity deep venous systems from the level of the common femoral vein and including the common femoral, femoral, profunda femoral, popliteal and calf veins including the posterior tibial, peroneal and gastrocnemius veins when visible. The superficial great saphenous vein was also interrogated. Spectral Doppler was utilized to evaluate flow at rest and with distal augmentation maneuvers in the common femoral, femoral and popliteal veins. COMPARISON:  None. FINDINGS: Contralateral Common Femoral Vein: Respiratory phasicity is normal and symmetric with the symptomatic side. No evidence of thrombus. Normal compressibility. Common Femoral Vein: No evidence of thrombus. Normal compressibility, respiratory phasicity and response to augmentation. Saphenofemoral Junction: No evidence of thrombus. Normal compressibility and flow on color Doppler imaging. Profunda Femoral Vein: No evidence of thrombus. Normal compressibility and flow on color Doppler imaging. Femoral Vein: No evidence of thrombus. Normal compressibility, respiratory phasicity and response to augmentation. Popliteal Vein: No evidence  of thrombus. Normal compressibility, respiratory phasicity and response to augmentation. Calf Veins: No evidence of thrombus. Normal compressibility and flow on color Doppler imaging. Superficial Great Saphenous Vein: No evidence of thrombus. Normal compressibility. Venous Reflux:  None. Other Findings:  None. IMPRESSION: No evidence of DVT within the left lower extremity. Electronically Signed   By: Simonne Come M.D.   On: 04/18/2021 12:01    Procedures Procedures   Medications Ordered in ED Medications - No data  to display  ED Course  I have reviewed the triage vital signs and the nursing notes.  Pertinent labs & imaging results that were available during my care of the patient were reviewed by me and considered in my medical decision making (see chart for details).    MDM Rules/Calculators/A&P                          Pt is a 36 y.o. female who presents to the emergency department due to atraumatic left calf pain for the past week.  Imaging: Venous ultrasound of the left lower extremity shows no evidence of DVT within the left lower extremity.  I, Placido Sou, PA-C, personally reviewed and evaluated these images and lab results as part of my medical decision-making.  Ultrasound appears reassuring.  Negative for DVT.  Patient does have palpable pain in the region.  No overlying skin changes or increased warmth.  Patient afebrile and not tachycardic.  Doubt developing cellulitis or other infectious etiology at this time.  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 Patient NVI distal to the calf.   Will discharge on a course of naproxen as well as Robaxin.  We discussed dosing as well as safety regarding these medications.  Patient has a Nexplanon implant and denies any possibility of being pregnant.  Patient understands these medications are not safe to take during pregnancy.  Discussed return precautions.  Feel that  she is stable for discharge at this time and she is agreeable.  Her questions were answered and she was amicable at the time of discharge.  Note: Portions of this report may have been transcribed using voice recognition software. Every effort was made to ensure accuracy; however, inadvertent computerized transcription errors may be present.   Final Clinical Impression(s) / ED Diagnoses Final diagnoses:  Left leg pain   Rx / DC Orders ED Discharge Orders          Ordered    methocarbamol (ROBAXIN) 500 MG tablet  2 times daily PRN        04/18/21 1214    naproxen (NAPROSYN) 500 MG tablet  2 times daily PRN        04/18/21 1214             Placido Sou, PA-C 04/18/21 1219    Ernie Avena, MD 04/18/21 1332

## 2021-04-18 NOTE — Discharge Instructions (Addendum)
I am prescribing you an anti-inflammatory medication called naproxen.  This is similar to Aleve.  You can take this up to 2 times a day for management of your pain.  Try to take it with a small amount of food to help prevent stomach irritation.  I am prescribing you a strong muscle relaxer called Robaxin.  You can take this up to 2 times a day for management of your pain.  This medication can be sedating so do not mix it with alcohol.  Do not drive a car after taking it.  If you develop any new or worsening symptoms please do not hesitate to return to the emergency department. It was a pleasure to meet you.

## 2021-04-18 NOTE — ED Triage Notes (Signed)
Left leg pain x 1 week.  Pt noticed some swelling and more warm than the other side.  Feels tight.

## 2021-11-24 ENCOUNTER — Other Ambulatory Visit: Payer: Self-pay

## 2021-11-24 ENCOUNTER — Encounter (HOSPITAL_BASED_OUTPATIENT_CLINIC_OR_DEPARTMENT_OTHER): Payer: Self-pay | Admitting: Emergency Medicine

## 2021-11-24 ENCOUNTER — Emergency Department (HOSPITAL_BASED_OUTPATIENT_CLINIC_OR_DEPARTMENT_OTHER)
Admission: EM | Admit: 2021-11-24 | Discharge: 2021-11-24 | Disposition: A | Discharge status: Home / Self Care | Payer: BC Managed Care – PPO | Attending: Emergency Medicine | Admitting: Emergency Medicine

## 2021-11-24 DIAGNOSIS — X58XXXA Exposure to other specified factors, initial encounter: Secondary | ICD-10-CM | POA: Insufficient documentation

## 2021-11-24 DIAGNOSIS — S93491A Sprain of other ligament of right ankle, initial encounter: Secondary | ICD-10-CM | POA: Diagnosis not present

## 2021-11-24 DIAGNOSIS — S99911A Unspecified injury of right ankle, initial encounter: Secondary | ICD-10-CM | POA: Diagnosis present

## 2021-11-24 MED ORDER — IBUPROFEN 600 MG PO TABS: 600.0000 mg | ORAL_TABLET | Freq: Four times a day (QID) | ORAL | 0 refills | Status: AC | PRN

## 2023-05-28 IMAGING — US US EXTREM LOW VENOUS*L*
1 series · 13 of 24 positions shown · non-contrast
Comparison: None.

CLINICAL DATA: Left lower extremity pain and edema for the past
week. Evaluate for DVT.



[Series 1: us extrem low venous*left* · 13 of 43 slices shown]
[im 1/43]
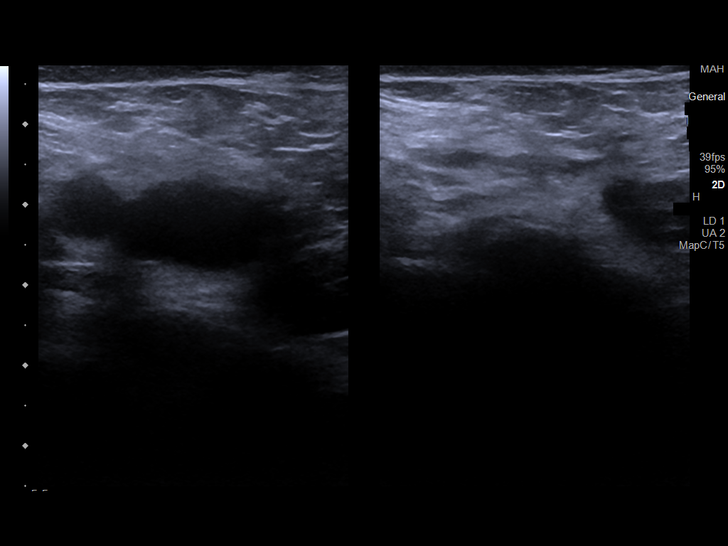
[im 4/43]
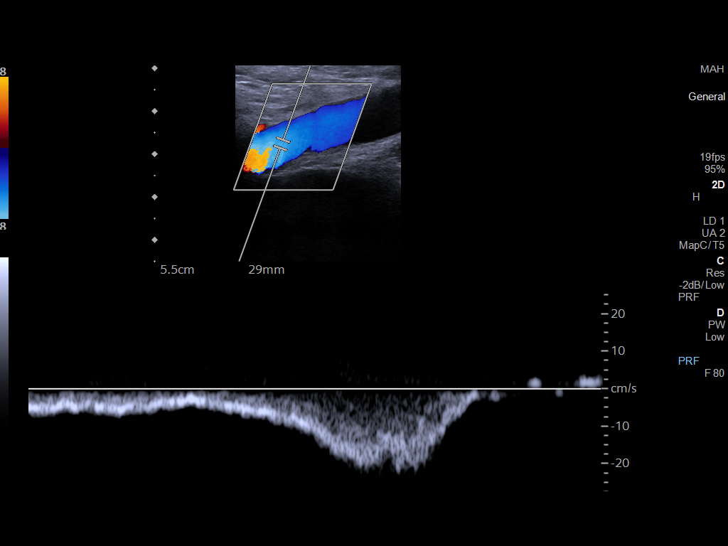
[im 8/43]
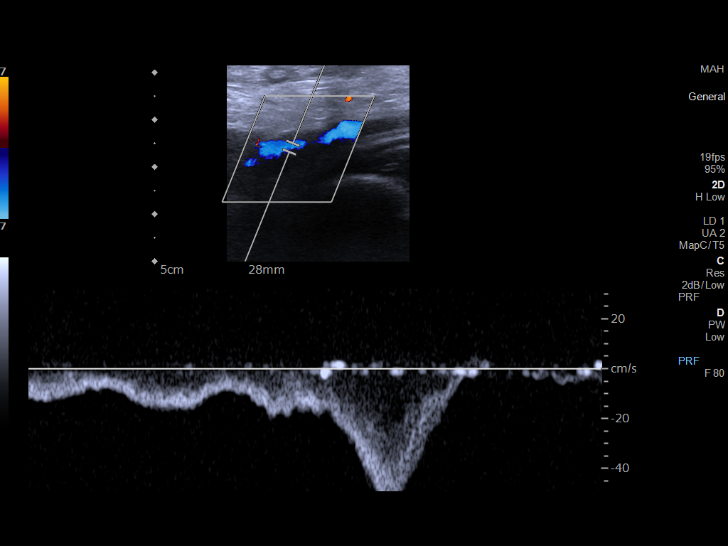
[im 11/43]
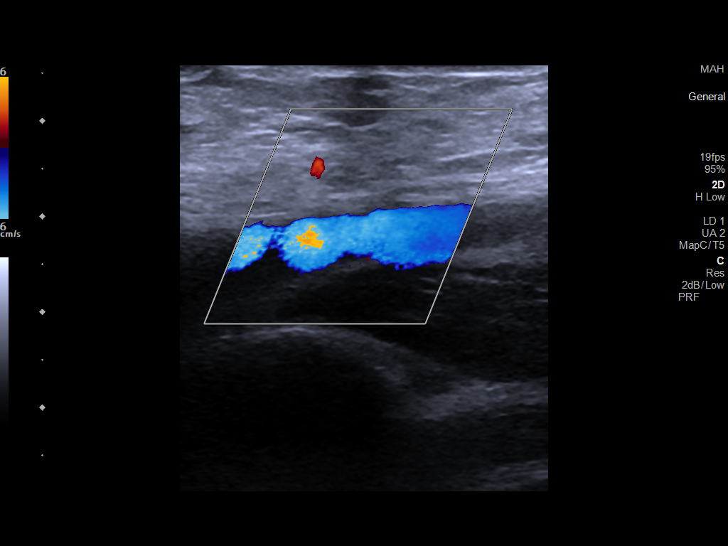
[im 15/43]
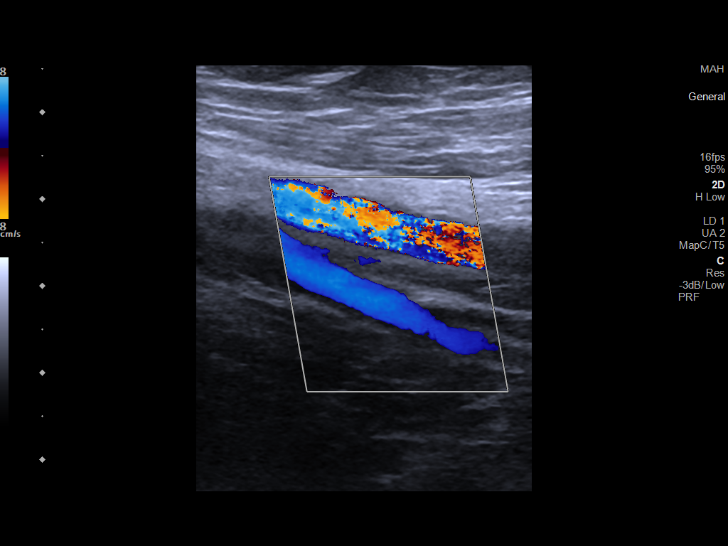
[im 19/43]
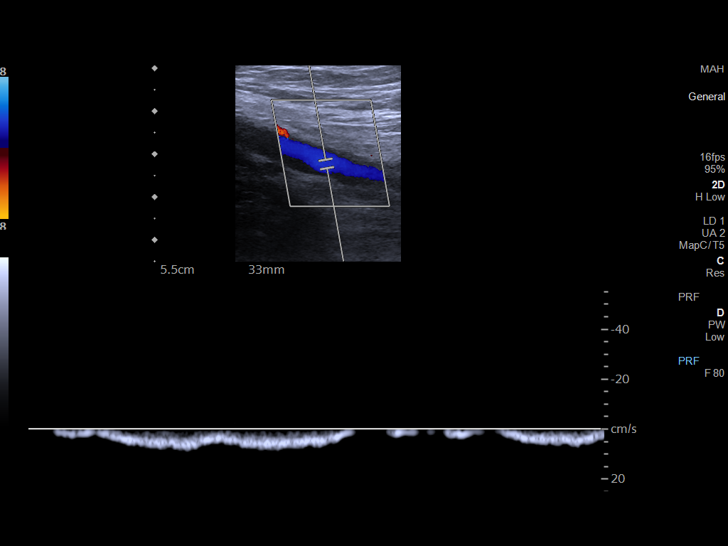
[im 22/43]
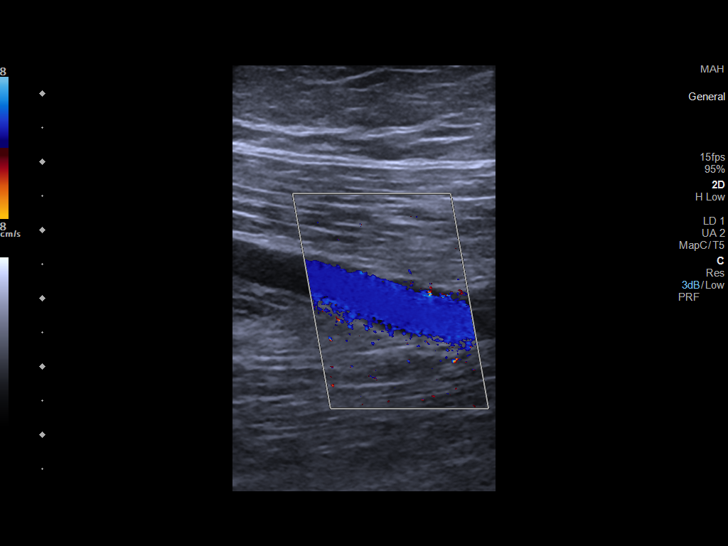
[im 24/43]
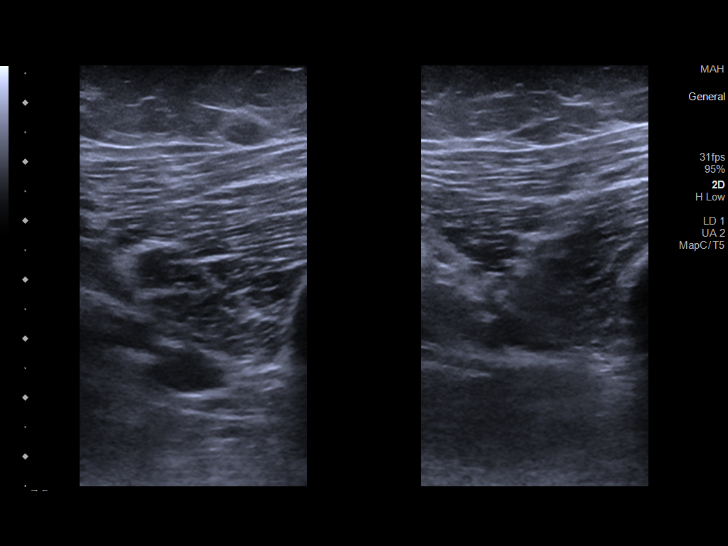
[im 28/43]
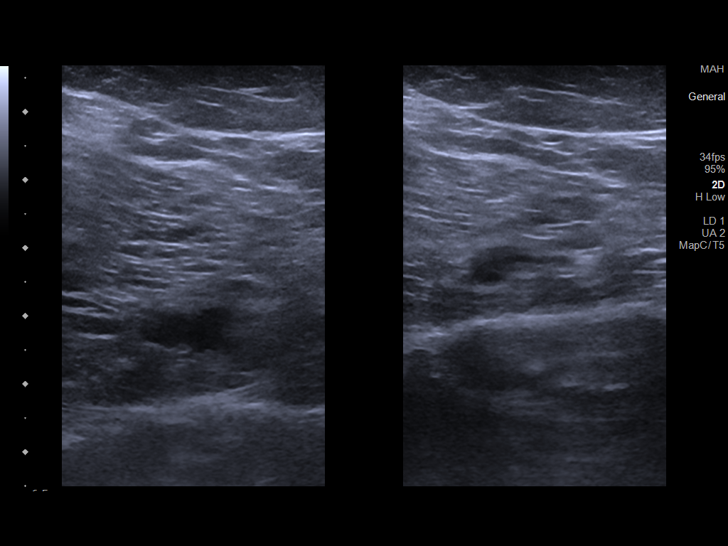
[im 32/43]
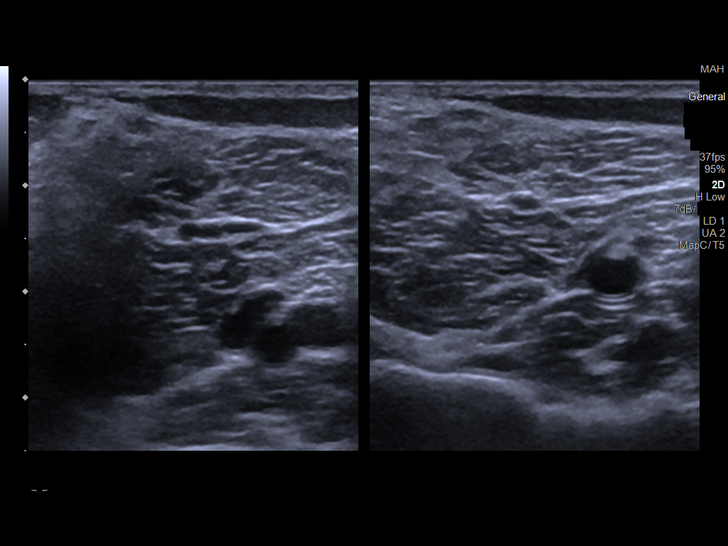
[im 35/43]
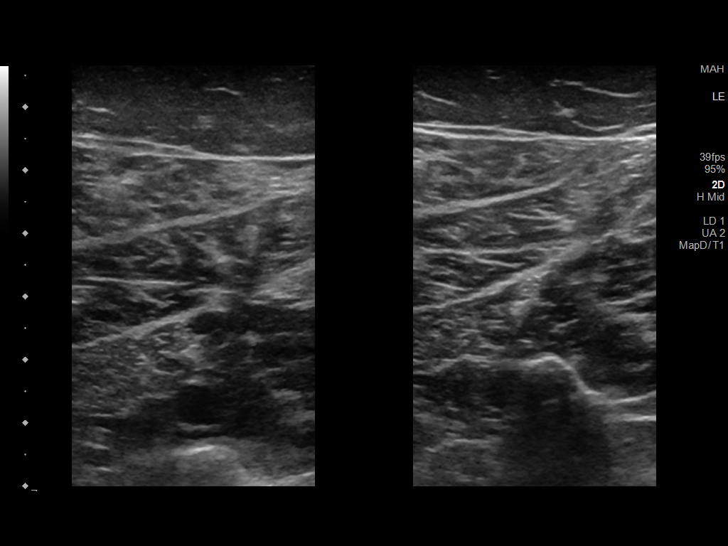
[im 39/43]
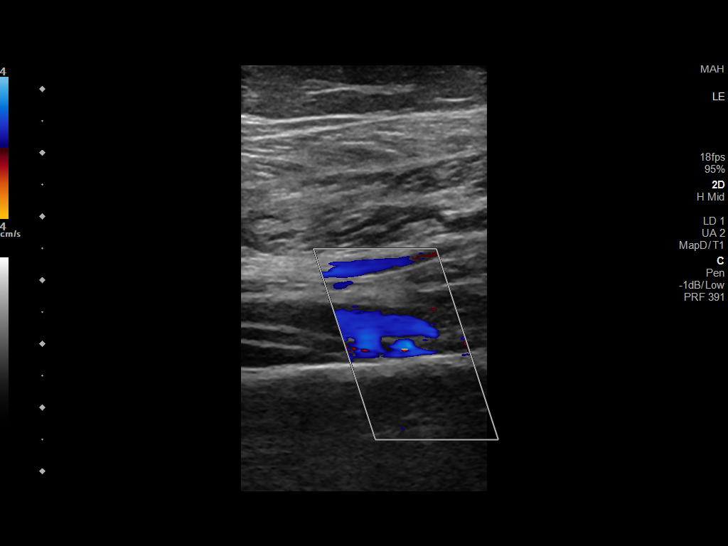
[im 43/43]
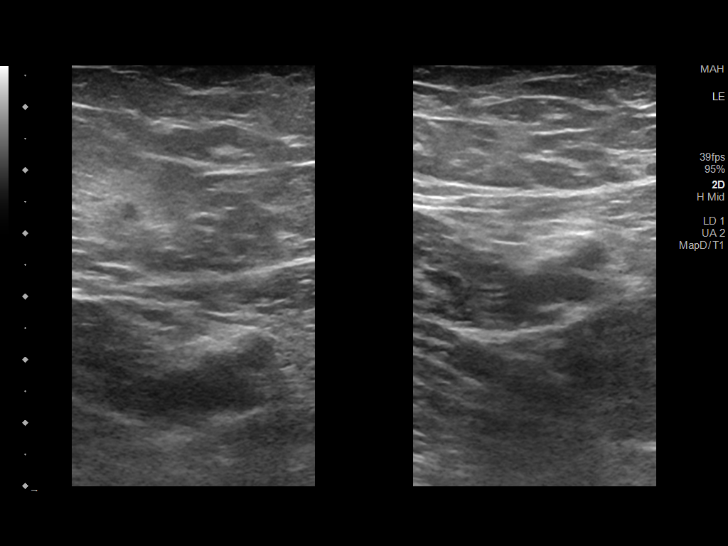

[13 of 24 positions shown; findings below may reference images not displayed]

FINDINGS: Contralateral Common Femoral Vein: Respiratory phasicity is normal
and symmetric with the symptomatic side. No evidence of thrombus.
Normal compressibility.

Common Femoral Vein: No evidence of thrombus. Normal
compressibility, respiratory phasicity and response to augmentation.

Saphenofemoral Junction: No evidence of thrombus. Normal
compressibility and flow on color Doppler imaging.

Profunda Femoral Vein: No evidence of thrombus. Normal
compressibility and flow on color Doppler imaging.

Femoral Vein: No evidence of thrombus. Normal compressibility,
respiratory phasicity and response to augmentation.

Popliteal Vein: No evidence of thrombus. Normal compressibility,
respiratory phasicity and response to augmentation.

Calf Veins: No evidence of thrombus. Normal compressibility and flow
on color Doppler imaging.

Superficial Great Saphenous Vein: No evidence of thrombus. Normal
compressibility.

Venous Reflux:  None.

Other Findings:  None.
IMPRESSION: No evidence of DVT within the left lower extremity.
